# Patient Record
Sex: Male | Born: 1975 | Race: Black or African American | Hispanic: No | Marital: Married | Smoking: Never smoker
Health system: Southern US, Community
[De-identification: ages and names within clinical notes are randomized; demographics above are authoritative.]

## PROBLEM LIST (undated history)

## (undated) DIAGNOSIS — I10 Essential (primary) hypertension: Secondary | ICD-10-CM

---

## 2006-07-06 ENCOUNTER — Other Ambulatory Visit: Payer: Self-pay

## 2006-07-06 ENCOUNTER — Emergency Department: Payer: Self-pay | Admitting: Emergency Medicine

## 2016-01-13 ENCOUNTER — Encounter: Payer: Self-pay | Admitting: *Deleted

## 2016-01-13 ENCOUNTER — Emergency Department: Payer: Self-pay

## 2016-01-13 ENCOUNTER — Emergency Department
Admission: EM | Admit: 2016-01-13 | Discharge: 2016-01-13 | Disposition: A | Discharge status: Home / Self Care | Payer: Self-pay | Attending: Emergency Medicine | Admitting: Emergency Medicine

## 2016-01-13 DIAGNOSIS — K8050 Calculus of bile duct without cholangitis or cholecystitis without obstruction: Secondary | ICD-10-CM

## 2016-01-13 DIAGNOSIS — K8020 Calculus of gallbladder without cholecystitis without obstruction: Secondary | ICD-10-CM

## 2016-01-13 DIAGNOSIS — K8070 Calculus of gallbladder and bile duct without cholecystitis without obstruction: Secondary | ICD-10-CM | POA: Insufficient documentation

## 2016-01-13 DIAGNOSIS — R1011 Right upper quadrant pain: Secondary | ICD-10-CM

## 2016-01-13 LAB — URINALYSIS COMPLETE WITH MICROSCOPIC (ARMC ONLY)
BACTERIA UA: NONE SEEN
Bilirubin Urine: NEGATIVE
Glucose, UA: NEGATIVE mg/dL
Ketones, ur: NEGATIVE mg/dL
Leukocytes, UA: NEGATIVE
Nitrite: NEGATIVE
PH: 7 (ref 5.0–8.0)
PROTEIN: NEGATIVE mg/dL
SQUAMOUS EPITHELIAL / LPF: NONE SEEN
Specific Gravity, Urine: 1.01 (ref 1.005–1.030)

## 2016-01-13 LAB — COMPREHENSIVE METABOLIC PANEL
ALK PHOS: 44 U/L (ref 38–126)
ALT: 25 U/L (ref 17–63)
AST: 40 U/L (ref 15–41)
Albumin: 4.4 g/dL (ref 3.5–5.0)
Anion gap: 6 (ref 5–15)
BUN: 13 mg/dL (ref 6–20)
CALCIUM: 9.3 mg/dL (ref 8.9–10.3)
CO2: 28 mmol/L (ref 22–32)
CREATININE: 1.16 mg/dL (ref 0.61–1.24)
Chloride: 105 mmol/L (ref 101–111)
GFR calc non Af Amer: >60 mL/min (ref >60)
Glucose, Bld: 125 mg/dL — ABNORMAL HIGH (ref 65–99)
Potassium: 3.6 mmol/L (ref 3.5–5.1)
SODIUM: 139 mmol/L (ref 135–145)
Total Bilirubin: 0.7 mg/dL (ref 0.3–1.2)
Total Protein: 8 g/dL (ref 6.5–8.1)

## 2016-01-13 LAB — CBC
HEMATOCRIT: 43.4 % (ref 40.0–52.0)
Hemoglobin: 14.6 g/dL (ref 13.0–18.0)
MCH: 29.7 pg (ref 26.0–34.0)
MCHC: 33.5 g/dL (ref 32.0–36.0)
MCV: 88.6 fL (ref 80.0–100.0)
Platelets: 325 10*3/uL (ref 150–440)
RBC: 4.9 MIL/uL (ref 4.40–5.90)
RDW: 12.2 % (ref 11.5–14.5)
WBC: 7.4 10*3/uL (ref 3.8–10.6)

## 2016-01-13 LAB — LIPASE, BLOOD: Lipase: 26 U/L (ref 11–51)

## 2016-01-13 MED ORDER — ONDANSETRON 4 MG PO TBDP: 4.0000 mg | ORAL_TABLET | Freq: Three times a day (TID) | ORAL | 0 refills | Status: DC | PRN

## 2016-01-13 MED ORDER — FAMOTIDINE IN NACL 20-0.9 MG/50ML-% IV SOLN
20.0000 mg | Freq: Once | INTRAVENOUS | Status: AC
  Administered 2016-01-13: 20 mg via INTRAVENOUS
  Filled 2016-01-13: qty 50

## 2016-01-13 MED ORDER — OXYCODONE-ACETAMINOPHEN 5-325 MG PO TABS: 1.0000 | ORAL_TABLET | ORAL | 0 refills | Status: DC | PRN

## 2016-01-13 NOTE — ED Notes (Signed)
Pt given additional information for follow-up for BP with Dr. Kenard Gowerrew as PCP. Pt also given BP that were taken while here to give his doctor.

## 2016-01-13 NOTE — ED Notes (Signed)
Pt returned to room from US.

## 2016-01-13 NOTE — Discharge Instructions (Signed)
1. Take medicines as needed for pain & nausea (Percocet/Zofran #20). 2. Clear liquids x 12 hours, then bland diet x 1 week, then slowly advance diet as tolerated. Avoid fatty, greasy, spicy foods and drinks. 3. Return to the ER for worsening symptoms, persistent vomiting, fever, difficulty breathing or other concerns.  

## 2016-01-13 NOTE — ED Triage Notes (Signed)
Pt c/o recurrent RUQ abdominal pain. Pt states he has had these episodes of pain approximately every other month. Pt states that the pain he is experiencing now is no different or worse than pain he has experienced in the past. Pt states after he woke to urinate, he was unable to return to sleep secondary to onset of pain. Pt denies urinary sxs. Pt denies n/v/d or constipation.

## 2016-01-13 NOTE — ED Provider Notes (Signed)
[facility] Emergency Department Provider Note   ____________________________________________   First MD Initiated Contact with Patient 01/13/16 [phone]     (approximate)  I have reviewed the triage vital signs and the nursing notes.   HISTORY  Chief Complaint Abdominal Pain    HPI Troy Garcia is a 40 y.o. male who presents to the ED from home with a chief complaint of abdominal pain. Patient reports right upper quadrant/epigastric sharp pain and burning discomfort felt when he awoke to urinate. Patient has had similar pain approximately 3 times before; has not had evaluation. Symptoms are not associated with fever, chills, nausea, vomiting. Patient denies recent chest pain, shortness of breath, dysuria, diarrhea. Denies recent travel or trauma. Nothing makes this pain better or worse.   Past medical history None  There are no active problems to display for this patient.   History reviewed. No pertinent surgical history.  Prior to Admission medications   Medication Sig Start Date End Date Taking? Authorizing Provider  ondansetron (ZOFRAN ODT) 4 MG disintegrating tablet Take 1 tablet (4 mg total) by mouth every 8 (eight) hours as needed for nausea or vomiting. 01/13/16   Irean HongJade J Neveen Daponte, MD  oxyCODONE-acetaminophen (ROXICET) 5-325 MG tablet Take 1 tablet by mouth every 4 (four) hours as needed for severe pain. 01/13/16   Irean HongJade J Shaughnessy Gethers, MD    Allergies Patient has no known allergies.  History reviewed. No pertinent family history.  Social History Social History  Substance Use Topics  . Smoking status: Never Smoker  . Smokeless tobacco: Never Used  . Alcohol use No    Review of Systems  Constitutional: No fever/chills. Eyes: No visual changes. ENT: No sore throat. Cardiovascular: Denies chest pain. Respiratory: Denies shortness of breath. Gastrointestinal: Positive for abdominal pain.  No nausea, no vomiting.  No diarrhea.  No  constipation. Genitourinary: Negative for dysuria. Musculoskeletal: Negative for back pain. Skin: Negative for rash. Neurological: Negative for headaches, focal weakness or numbness.  10-point ROS otherwise negative.  ____________________________________________   PHYSICAL EXAM:  VITAL SIGNS: ED Triage Vitals  Enc Vitals Group     BP 01/13/16 0424 (!) 150/100     Pulse Rate 01/13/16 0424 70     Resp 01/13/16 0424 18     Temp 01/13/16 0424 98.4 F (36.9 C)     Temp Source 01/13/16 0424 Oral     SpO2 01/13/16 0424 98 %     Weight 01/13/16 0424 263 lb (119.3 kg)     Height 01/13/16 0424 5\' 10"  (1.778 m)     Head Circumference --      Peak Flow --      Pain Score 01/13/16 0425 6     Pain Loc --      Pain Edu? --      Excl. in GC? --     Constitutional: Alert and oriented. Well appearing and in no acute distress. Eyes: Conjunctivae are normal. PERRL. EOMI. Head: Atraumatic. Nose: No congestion/rhinnorhea. Mouth/Throat: Mucous membranes are moist.  Oropharynx non-erythematous. Neck: No stridor.   Cardiovascular: Normal rate, regular rhythm. Grossly normal heart sounds.  Good peripheral circulation. Respiratory: Normal respiratory effort.  No retractions. Lungs CTAB. Gastrointestinal: Soft and mildly tender to palpation right upper quadrant and epigastrium without rebound or guarding. No distention. No abdominal bruits. No CVA tenderness. Musculoskeletal: No lower extremity tenderness nor edema.  No joint effusions. Neurologic:  Normal speech and language. No gross focal neurologic deficits are appreciated. No gait instability.  Skin:  Skin is warm, dry and intact. No rash noted. Psychiatric: Mood and affect are normal. Speech and behavior are normal.  ____________________________________________   LABS (all labs ordered are listed, but only abnormal results are displayed)  Labs Reviewed  COMPREHENSIVE METABOLIC PANEL - Abnormal; Notable for the following:       Result  Value   Glucose, Bld 125 (*)    All other components within normal limits  URINALYSIS COMPLETEWITH MICROSCOPIC ([facility] ONLY) - Abnormal; Notable for the following:    Color, Urine STRAW (*)    APPearance CLEAR (*)    Hgb urine dipstick 1+ (*)    All other components within normal limits  LIPASE, BLOOD  CBC   ____________________________________________  EKG  None ____________________________________________  RADIOLOGY  RUQ ultrasound interpreted per Dr. Harrie JeansStratton: Gallstones and gallbladder sludge. No secondary signs of  cholecystitis.   ____________________________________________   PROCEDURES  Procedure(s) performed: None  Procedures  Critical Care performed: No  ____________________________________________   INITIAL IMPRESSION / ASSESSMENT AND PLAN / ED COURSE  Pertinent labs & imaging results that were available during my care of the patient were reviewed by me and considered in my medical decision making (see chart for details).  40 year old male who presents with recurrent right upper quadrant abdominal pain. Clinically suspect cholelithiasis. Will obtain screening lab work, right upper quadrant abdominal ultrasound; administer IV Pepcid for burning type sensation.  Clinical Course as of Jan 12 546  Fri Jan 13, 2016  [id number] Updated patient of laboratory and imaging results. Will refer to general surgery for outpatient follow-up. Strict return precautions given. Patient verbalizes understanding and agrees with plan of care.  [JS]    Clinical Course User Index [JS] Irean HongJade J Katalena Malveaux, MD     ____________________________________________   FINAL CLINICAL IMPRESSION(S) / ED DIAGNOSES  Final diagnoses:  Right upper quadrant abdominal pain  Calculus of gallbladder without cholecystitis without obstruction  Biliary colic      NEW MEDICATIONS STARTED DURING THIS VISIT:  New Prescriptions   ONDANSETRON (ZOFRAN ODT) 4 MG DISINTEGRATING TABLET    Take 1 tablet (4  mg total) by mouth every 8 (eight) hours as needed for nausea or vomiting.   OXYCODONE-ACETAMINOPHEN (ROXICET) 5-325 MG TABLET    Take 1 tablet by mouth every 4 (four) hours as needed for severe pain.     Note:  This document was prepared using Dragon voice recognition software and may include unintentional dictation errors.    Irean HongJade J Juelz Claar, MD 01/13/16 [phone]

## 2016-01-13 NOTE — ED Notes (Signed)
Pt in US

## 2016-01-13 NOTE — ED Notes (Signed)
Pt stating that he has had 3 episodes of RUQ pain. Pt stating that each time he is awakened from sleep by the pain. Pt denying n/v/d , changes in BM , and no issues with urination. Pt stating that the pain start gradual and grows in intensity and then goes away.

## 2016-01-19 ENCOUNTER — Ambulatory Visit: Payer: Self-pay | Admitting: Surgery

## 2016-06-13 ENCOUNTER — Encounter: Payer: Self-pay | Admitting: *Deleted

## 2016-06-13 ENCOUNTER — Emergency Department: Payer: Medicaid Other | Admitting: Anesthesiology

## 2016-06-13 ENCOUNTER — Observation Stay
Admission: EM | Admit: 2016-06-13 | Discharge: 2016-06-14 | Disposition: A | Discharge status: Home / Self Care | Payer: Medicaid Other | Attending: Surgery | Admitting: Surgery

## 2016-06-13 ENCOUNTER — Encounter
Admission: EM | Disposition: A | Discharge status: Home / Self Care | Payer: Self-pay | Source: Home / Self Care | Attending: Emergency Medicine

## 2016-06-13 ENCOUNTER — Emergency Department: Payer: Medicaid Other

## 2016-06-13 DIAGNOSIS — R1011 Right upper quadrant pain: Secondary | ICD-10-CM

## 2016-06-13 DIAGNOSIS — K660 Peritoneal adhesions (postprocedural) (postinfection): Secondary | ICD-10-CM | POA: Insufficient documentation

## 2016-06-13 DIAGNOSIS — K8010 Calculus of gallbladder with chronic cholecystitis without obstruction: Principal | ICD-10-CM | POA: Insufficient documentation

## 2016-06-13 DIAGNOSIS — K819 Cholecystitis, unspecified: Secondary | ICD-10-CM

## 2016-06-13 DIAGNOSIS — K810 Acute cholecystitis: Secondary | ICD-10-CM | POA: Diagnosis present

## 2016-06-13 DIAGNOSIS — K8050 Calculus of bile duct without cholangitis or cholecystitis without obstruction: Secondary | ICD-10-CM

## 2016-06-13 DIAGNOSIS — Z6838 Body mass index (BMI) 38.0-38.9, adult: Secondary | ICD-10-CM | POA: Diagnosis not present

## 2016-06-13 HISTORY — PX: CHOLECYSTECTOMY: SHX55

## 2016-06-13 LAB — COMPREHENSIVE METABOLIC PANEL
ALBUMIN: 4.3 g/dL (ref 3.5–5.0)
ALT: 30 U/L (ref 17–63)
ANION GAP: 11 (ref 5–15)
AST: 47 U/L — AB (ref 15–41)
Alkaline Phosphatase: 48 U/L (ref 38–126)
BILIRUBIN TOTAL: 0.6 mg/dL (ref 0.3–1.2)
BUN: 9 mg/dL (ref 6–20)
CHLORIDE: 101 mmol/L (ref 101–111)
CO2: 24 mmol/L (ref 22–32)
Calcium: 9.2 mg/dL (ref 8.9–10.3)
Creatinine, Ser: 1.04 mg/dL (ref 0.61–1.24)
GFR calc Af Amer: >60 mL/min (ref >60)
Glucose, Bld: 157 mg/dL — ABNORMAL HIGH (ref 65–99)
POTASSIUM: 3.7 mmol/L (ref 3.5–5.1)
Sodium: 136 mmol/L (ref 135–145)
TOTAL PROTEIN: 8.1 g/dL (ref 6.5–8.1)

## 2016-06-13 LAB — URINALYSIS, COMPLETE (UACMP) WITH MICROSCOPIC
BACTERIA UA: NONE SEEN
BILIRUBIN URINE: NEGATIVE
Glucose, UA: NEGATIVE mg/dL
Hgb urine dipstick: NEGATIVE
Ketones, ur: NEGATIVE mg/dL
LEUKOCYTES UA: NEGATIVE
NITRITE: NEGATIVE
Protein, ur: 100 mg/dL — AB
SQUAMOUS EPITHELIAL / LPF: NONE SEEN
Specific Gravity, Urine: 1.027 (ref 1.005–1.030)
pH: 8 (ref 5.0–8.0)

## 2016-06-13 LAB — LIPASE, BLOOD: LIPASE: 21 U/L (ref 11–51)

## 2016-06-13 LAB — CBC
HEMATOCRIT: 43.8 % (ref 40.0–52.0)
HEMOGLOBIN: 14.5 g/dL (ref 13.0–18.0)
MCH: 29.5 pg (ref 26.0–34.0)
MCHC: 33.2 g/dL (ref 32.0–36.0)
MCV: 88.9 fL (ref 80.0–100.0)
Platelets: 382 10*3/uL (ref 150–440)
RBC: 4.93 MIL/uL (ref 4.40–5.90)
RDW: 12 % (ref 11.5–14.5)
WBC: 14.4 10*3/uL — AB (ref 3.8–10.6)

## 2016-06-13 SURGERY — LAPAROSCOPIC CHOLECYSTECTOMY
Anesthesia: General | Wound class: Clean

## 2016-06-13 MED ORDER — BUPIVACAINE-EPINEPHRINE 0.25% -1:200000 IJ SOLN
INTRAMUSCULAR | Status: DC | PRN
  Administered 2016-06-13: 30 mL

## 2016-06-13 MED ORDER — DEXAMETHASONE SODIUM PHOSPHATE 10 MG/ML IJ SOLN
INTRAMUSCULAR | Status: DC | PRN
  Administered 2016-06-13: 10 mg via INTRAVENOUS

## 2016-06-13 MED ORDER — BUPIVACAINE LIPOSOME 1.3 % IJ SUSP
INTRAMUSCULAR | Status: AC
  Filled 2016-06-13: qty 20

## 2016-06-13 MED ORDER — FENTANYL CITRATE (PF) 100 MCG/2ML IJ SOLN
INTRAMUSCULAR | Status: DC | PRN
  Administered 2016-06-13: 25 ug via INTRAVENOUS
  Administered 2016-06-13: 50 ug via INTRAVENOUS
  Administered 2016-06-13: 25 ug via INTRAVENOUS
  Administered 2016-06-13 (×2): 50 ug via INTRAVENOUS

## 2016-06-13 MED ORDER — SUGAMMADEX SODIUM 200 MG/2ML IV SOLN
INTRAVENOUS | Status: DC | PRN
  Administered 2016-06-13: 250 mg via INTRAVENOUS

## 2016-06-13 MED ORDER — ONDANSETRON HCL 4 MG/2ML IJ SOLN: 4.0000 mg | Freq: Four times a day (QID) | INTRAMUSCULAR | Status: DC | PRN

## 2016-06-13 MED ORDER — PROPOFOL 10 MG/ML IV BOLUS
INTRAVENOUS | Status: DC | PRN
  Administered 2016-06-13: 200 mg via INTRAVENOUS

## 2016-06-13 MED ORDER — FENTANYL CITRATE (PF) 100 MCG/2ML IJ SOLN
INTRAMUSCULAR | Status: AC
Start: 2016-06-13 — End: 2016-06-13
  Filled 2016-06-13: qty 2

## 2016-06-13 MED ORDER — BUPIVACAINE-EPINEPHRINE (PF) 0.25% -1:200000 IJ SOLN
INTRAMUSCULAR | Status: AC
  Filled 2016-06-13: qty 30

## 2016-06-13 MED ORDER — SODIUM CHLORIDE 0.9 % IV SOLN
INTRAVENOUS | Status: DC | PRN
  Administered 2016-06-13: 70 mL

## 2016-06-13 MED ORDER — PHENYLEPHRINE HCL 10 MG/ML IJ SOLN
INTRAMUSCULAR | Status: DC | PRN
  Administered 2016-06-13: 200 ug via INTRAVENOUS

## 2016-06-13 MED ORDER — FENTANYL CITRATE (PF) 100 MCG/2ML IJ SOLN
INTRAMUSCULAR | Status: AC
  Administered 2016-06-13: 25 ug via INTRAVENOUS
  Filled 2016-06-13: qty 2

## 2016-06-13 MED ORDER — PIPERACILLIN-TAZOBACTAM 3.375 G IVPB
3.3750 g | Freq: Three times a day (TID) | INTRAVENOUS | Status: DC
  Administered 2016-06-13 – 2016-06-14 (×2): 3.375 g via INTRAVENOUS
  Filled 2016-06-13 (×4): qty 50

## 2016-06-13 MED ORDER — PANTOPRAZOLE SODIUM 40 MG IV SOLR
40.0000 mg | Freq: Every day | INTRAVENOUS | Status: DC
  Administered 2016-06-13: 40 mg via INTRAVENOUS
  Filled 2016-06-13: qty 40

## 2016-06-13 MED ORDER — MORPHINE SULFATE (PF) 4 MG/ML IV SOLN: 2.0000 mg | INTRAVENOUS | Status: DC | PRN

## 2016-06-13 MED ORDER — PROPOFOL 10 MG/ML IV BOLUS
INTRAVENOUS | Status: AC
  Filled 2016-06-13: qty 20

## 2016-06-13 MED ORDER — ACETAMINOPHEN 10 MG/ML IV SOLN
INTRAVENOUS | Status: AC
  Filled 2016-06-13: qty 100

## 2016-06-13 MED ORDER — SODIUM CHLORIDE 0.9 % IJ SOLN
INTRAMUSCULAR | Status: AC
  Filled 2016-06-13: qty 20

## 2016-06-13 MED ORDER — DEXAMETHASONE SODIUM PHOSPHATE 10 MG/ML IJ SOLN
INTRAMUSCULAR | Status: AC
  Filled 2016-06-13: qty 1

## 2016-06-13 MED ORDER — SODIUM CHLORIDE 0.9 % IV BOLUS (SEPSIS)
1000.0000 mL | Freq: Once | INTRAVENOUS | Status: AC
  Administered 2016-06-13: 1000 mL via INTRAVENOUS

## 2016-06-13 MED ORDER — ONDANSETRON HCL 4 MG/2ML IJ SOLN: 4.0000 mg | Freq: Once | INTRAMUSCULAR | Status: DC | PRN

## 2016-06-13 MED ORDER — ACETAMINOPHEN 10 MG/ML IV SOLN
INTRAVENOUS | Status: DC | PRN
  Administered 2016-06-13: 1000 mg via INTRAVENOUS

## 2016-06-13 MED ORDER — LACTATED RINGERS IV SOLN
INTRAVENOUS | Status: DC
  Administered 2016-06-13 – 2016-06-14 (×2): via INTRAVENOUS

## 2016-06-13 MED ORDER — OXYCODONE HCL 5 MG PO TABS
5.0000 mg | ORAL_TABLET | ORAL | Status: DC | PRN
  Administered 2016-06-13: 5 mg via ORAL
  Filled 2016-06-13: qty 1

## 2016-06-13 MED ORDER — PIPERACILLIN-TAZOBACTAM 3.375 G IVPB
INTRAVENOUS | Status: AC
  Administered 2016-06-13: 3.375 g via INTRAVENOUS
  Filled 2016-06-13: qty 50

## 2016-06-13 MED ORDER — LIDOCAINE HCL (CARDIAC) 20 MG/ML IV SOLN
INTRAVENOUS | Status: DC | PRN
  Administered 2016-06-13: 50 mg via INTRAVENOUS

## 2016-06-13 MED ORDER — KETOROLAC TROMETHAMINE 30 MG/ML IJ SOLN
30.0000 mg | Freq: Four times a day (QID) | INTRAMUSCULAR | Status: DC
  Administered 2016-06-13 – 2016-06-14 (×3): 30 mg via INTRAVENOUS
  Filled 2016-06-13 (×3): qty 1

## 2016-06-13 MED ORDER — LIDOCAINE HCL (PF) 2 % IJ SOLN
INTRAMUSCULAR | Status: AC
  Filled 2016-06-13: qty 2

## 2016-06-13 MED ORDER — ONDANSETRON HCL 4 MG/2ML IJ SOLN
INTRAMUSCULAR | Status: DC | PRN
  Administered 2016-06-13: 4 mg via INTRAVENOUS

## 2016-06-13 MED ORDER — KETOROLAC TROMETHAMINE 30 MG/ML IJ SOLN
INTRAMUSCULAR | Status: DC | PRN
  Administered 2016-06-13: 30 mg via INTRAVENOUS

## 2016-06-13 MED ORDER — ROCURONIUM BROMIDE 100 MG/10ML IV SOLN
INTRAVENOUS | Status: AC
  Filled 2016-06-13: qty 1

## 2016-06-13 MED ORDER — PIPERACILLIN-TAZOBACTAM 3.375 G IVPB 30 MIN: 3.3750 g | Freq: Once | INTRAVENOUS | Status: DC

## 2016-06-13 MED ORDER — MIDAZOLAM HCL 2 MG/2ML IJ SOLN
INTRAMUSCULAR | Status: AC
  Filled 2016-06-13: qty 2

## 2016-06-13 MED ORDER — MIDAZOLAM HCL 2 MG/2ML IJ SOLN
INTRAMUSCULAR | Status: DC | PRN
  Administered 2016-06-13: 2 mg via INTRAVENOUS

## 2016-06-13 MED ORDER — FENTANYL CITRATE (PF) 100 MCG/2ML IJ SOLN
INTRAMUSCULAR | Status: AC
  Filled 2016-06-13: qty 2

## 2016-06-13 MED ORDER — FENTANYL CITRATE (PF) 100 MCG/2ML IJ SOLN
25.0000 ug | INTRAMUSCULAR | Status: DC | PRN
Start: 2016-06-13 — End: 2016-06-13
  Administered 2016-06-13 (×4): 25 ug via INTRAVENOUS

## 2016-06-13 MED ORDER — LACTATED RINGERS IV SOLN
INTRAVENOUS | Status: DC
  Administered 2016-06-13: 14:00:00 via INTRAVENOUS

## 2016-06-13 MED ORDER — ONDANSETRON HCL 4 MG/2ML IJ SOLN
INTRAMUSCULAR | Status: AC
  Filled 2016-06-13: qty 2

## 2016-06-13 MED ORDER — ONDANSETRON 4 MG PO TBDP: 4.0000 mg | ORAL_TABLET | Freq: Four times a day (QID) | ORAL | Status: DC | PRN

## 2016-06-13 MED ORDER — ACETAMINOPHEN 500 MG PO TABS
1000.0000 mg | ORAL_TABLET | Freq: Four times a day (QID) | ORAL | Status: DC
  Administered 2016-06-13 – 2016-06-14 (×3): 1000 mg via ORAL
  Filled 2016-06-13 (×3): qty 2

## 2016-06-13 MED ORDER — ENOXAPARIN SODIUM 40 MG/0.4ML ~~LOC~~ SOLN
40.0000 mg | SUBCUTANEOUS | Status: DC
  Administered 2016-06-14: 40 mg via SUBCUTANEOUS
  Filled 2016-06-13: qty 0.4

## 2016-06-13 MED ORDER — ROCURONIUM BROMIDE 100 MG/10ML IV SOLN
INTRAVENOUS | Status: DC | PRN
  Administered 2016-06-13: 20 mg via INTRAVENOUS
  Administered 2016-06-13: 40 mg via INTRAVENOUS
  Administered 2016-06-13: 20 mg via INTRAVENOUS

## 2016-06-13 SURGICAL SUPPLY — 60 items
APPLICATOR COTTON TIP 6IN STRL (MISCELLANEOUS) ×3 IMPLANT
APPLIER CLIP 5 13 M/L LIGAMAX5 (MISCELLANEOUS) ×3
BLADE SURG 15 STRL LF DISP TIS (BLADE) ×1 IMPLANT
BLADE SURG 15 STRL SS (BLADE) ×2
CANISTER SUCT 1200ML W/VALVE (MISCELLANEOUS) ×3 IMPLANT
CANISTER SUCT 3000ML (MISCELLANEOUS) ×3 IMPLANT
CHLORAPREP W/TINT 26ML (MISCELLANEOUS) ×3 IMPLANT
CHOLANGIOGRAM CATH TAUT (CATHETERS) IMPLANT
CLEANER CAUTERY TIP 5X5 PAD (MISCELLANEOUS) ×1 IMPLANT
CLIP APPLIE 5 13 M/L LIGAMAX5 (MISCELLANEOUS) ×1 IMPLANT
DECANTER SPIKE VIAL GLASS SM (MISCELLANEOUS) ×6 IMPLANT
DERMABOND ADVANCED (GAUZE/BANDAGES/DRESSINGS) ×4
DERMABOND ADVANCED .7 DNX12 (GAUZE/BANDAGES/DRESSINGS) ×2 IMPLANT
DEVICE TROCAR PUNCTURE CLOSURE (ENDOMECHANICALS) IMPLANT
DRAIN CHANNEL JP 19F (MISCELLANEOUS) ×3 IMPLANT
DRAPE C-ARM XRAY 36X54 (DRAPES) IMPLANT
DRAPE INCISE IOBAN 66X45 STRL (DRAPES) ×3 IMPLANT
ELECT CAUTERY BLADE 6.4 (BLADE) ×3 IMPLANT
ELECT REM PT RETURN 9FT ADLT (ELECTROSURGICAL) ×3
ELECTRODE REM PT RTRN 9FT ADLT (ELECTROSURGICAL) ×1 IMPLANT
ENDOPOUCH RETRIEVER 10 (MISCELLANEOUS) ×3 IMPLANT
GLOVE BIO SURGEON STRL SZ7 (GLOVE) ×9 IMPLANT
GLOVE BIOGEL PI IND STRL 6.5 (GLOVE) ×1 IMPLANT
GLOVE BIOGEL PI IND STRL 7.0 (GLOVE) ×1 IMPLANT
GLOVE BIOGEL PI INDICATOR 6.5 (GLOVE) ×2
GLOVE BIOGEL PI INDICATOR 7.0 (GLOVE) ×2
GOWN STRL REUS W/ TWL LRG LVL3 (GOWN DISPOSABLE) ×3 IMPLANT
GOWN STRL REUS W/TWL LRG LVL3 (GOWN DISPOSABLE) ×6
HEMOSTAT SURGICEL 2X14 (HEMOSTASIS) ×6 IMPLANT
IRRIGATION STRYKERFLOW (MISCELLANEOUS) ×1 IMPLANT
IRRIGATOR STRYKERFLOW (MISCELLANEOUS) ×3
IV CATH ANGIO 12GX3 LT BLUE (NEEDLE) ×3 IMPLANT
IV NS 1000ML (IV SOLUTION) ×4
IV NS 1000ML BAXH (IV SOLUTION) ×2 IMPLANT
L-HOOK LAP DISP 36CM (ELECTROSURGICAL) ×3
LHOOK LAP DISP 36CM (ELECTROSURGICAL) ×1 IMPLANT
NEEDLE HYPO 22GX1.5 SAFETY (NEEDLE) ×6 IMPLANT
PACK LAP CHOLECYSTECTOMY (MISCELLANEOUS) ×3 IMPLANT
PAD CLEANER CAUTERY TIP 5X5 (MISCELLANEOUS) ×2
PENCIL ELECTRO HAND CTR (MISCELLANEOUS) ×3 IMPLANT
SCISSORS METZENBAUM CVD 33 (INSTRUMENTS) ×3 IMPLANT
SLEEVE ENDOPATH XCEL 5M (ENDOMECHANICALS) ×9 IMPLANT
SOL ANTI-FOG 6CC FOG-OUT (MISCELLANEOUS) ×1 IMPLANT
SOL FOG-OUT ANTI-FOG 6CC (MISCELLANEOUS) ×2
SPONGE LAP 18X18 5 PK (GAUZE/BANDAGES/DRESSINGS) ×6 IMPLANT
STOPCOCK 3 WAY  REPLAC (MISCELLANEOUS) IMPLANT
SUT ETHIBOND 0 MO6 C/R (SUTURE) IMPLANT
SUT MNCRL AB 4-0 PS2 18 (SUTURE) ×3 IMPLANT
SUT PDS AB 0 CT1 27 (SUTURE) ×6 IMPLANT
SUT SILK 2 0 SH (SUTURE) ×3 IMPLANT
SUT VIC AB 0 CT2 27 (SUTURE) IMPLANT
SUT VICRYL 0 AB UR-6 (SUTURE) ×6 IMPLANT
SYR 20CC LL (SYRINGE) ×9 IMPLANT
SYS LAPSCP GELPORT 120MM (MISCELLANEOUS) ×3
SYSTEM LAPSCP GELPORT 120MM (MISCELLANEOUS) ×1 IMPLANT
TAPE TRANSPORE STRL 2 31045 (GAUZE/BANDAGES/DRESSINGS) ×3 IMPLANT
TROCAR XCEL BLUNT TIP 100MML (ENDOMECHANICALS) ×3 IMPLANT
TROCAR XCEL NON-BLD 5MMX100MML (ENDOMECHANICALS) ×3 IMPLANT
TUBING INSUFFLATOR HI FLOW (MISCELLANEOUS) ×3 IMPLANT
WATER STERILE IRR 1000ML POUR (IV SOLUTION) ×3 IMPLANT

## 2016-06-13 NOTE — Anesthesia Post-op Follow-up Note (Cosign Needed)
Anesthesia QCDR form completed.        

## 2016-06-13 NOTE — ED Provider Notes (Addendum)
[facility] Emergency Department Provider Note   ____________________________________________   First MD Initiated Contact with Patient 06/13/16 1110     (approximate)  I have reviewed the triage vital signs and the nursing notes.   HISTORY  Chief Complaint Abdominal Pain    HPI Troy Garcia is a 41 y.o. male patient was seen yesterday in the ER with right upper quadrant pain and diagnosed with biliary colic. He ate a pizza last night and the pain came back again so he came back as instructed. Having right upper quadrant pain. Ultrasound yesterday showed gallbladder stones and sludge but no obstruction or signs of cholecystitis.  History reviewed. No pertinent past medical history.  Patient Active Problem List   Diagnosis Date Noted  . Cholecystitis, acute     History reviewed. No pertinent surgical history.  Prior to Admission medications   Medication Sig Start Date End Date Taking? Authorizing Provider  acetaminophen (TYLENOL) 500 MG tablet Take 500 mg by mouth every 6 (six) hours as needed.   Yes Historical Provider, MD  ibuprofen (ADVIL,MOTRIN) 200 MG tablet Take 200 mg by mouth every 6 (six) hours as needed.   Yes Historical Provider, MD  oxyCODONE-acetaminophen (ROXICET) 5-325 MG tablet Take 1 tablet by mouth every 4 (four) hours as needed for severe pain. 01/13/16  Yes Irean Hong, MD  ondansetron (ZOFRAN ODT) 4 MG disintegrating tablet Take 1 tablet (4 mg total) by mouth every 8 (eight) hours as needed for nausea or vomiting. Patient not taking: Reported on 06/13/2016 01/13/16   Irean Hong, MD    Allergies Patient has no known allergies.  History reviewed. No pertinent family history.  Social History Social History  Substance Use Topics  . Smoking status: Never Smoker  . Smokeless tobacco: Never Used  . Alcohol use No    Review of Systems  Constitutional: No fever/chills Eyes: No visual changes. ENT: No sore  throat. Cardiovascular: Denies chest pain. Respiratory: Denies shortness of breath. Gastrointestinal abdominal pain.  Genitourinary: Negative for dysuria. Musculoskeletal: Negative for back pain. Skin: Negative for rash. Neurological: Negative for headaches, focal weakness or numbness.   ____________________________________________   PHYSICAL EXAM:  VITAL SIGNS: ED Triage Vitals patient   Enc Vitals Group     BP 139/74     Pulse Rate 95     Resp 18     Temp (!) 100.9 F (38.3 C)     Temp Source Oral     SpO2 95 %     Weight 267 lb (121.1 kg)     Height  (1.778 m)     Head Circumference      Peak Flow      Pain Score 9     Pain Loc      Pain Edu?      Excl. in GC?     Constitutional: Alert and oriented. Well appearing and in no acute distress. Eyes: Conjunctivae are normal. PERRL. EOMI. Head: Atraumatic. Nose: No congestion/rhinnorhea. Mouth/Throat: Mucous membranes are moist.  Oropharynx non-erythematous. Neck: No stridor Cardiovascular: Normal rate, regular rhythm. Grossly normal heart sounds.  Good peripheral circulation. Respiratory: Normal respiratory effort.  No retractions. Lungs CTAB. Gastrointestinal: Soft With right upper quadrant tenderness palpation percussion especially in the area of the gallbladder. No distention. No abdominal bruits. No CVA tenderness. Musculoskeletal: No lower extremity tenderness nor edema.  No joint effusions. Neurologic:  Normal speech and language. No gross focal neurologic deficits are appreciated. No gait instability.  Skin:  Skin is warm, dry and intact. No rash noted. Psychiatric: Mood and affect are normal. Speech and behavior are normal.  ____________________________________________   LABS (all labs ordered are listed, but only abnormal results are displayed)  Labs Reviewed  COMPREHENSIVE METABOLIC PANEL - Abnormal; Notable for the following:       Result Value   Glucose, Bld 157 (*)    AST 47 (*)    All other  components within normal limits  CBC - Abnormal; Notable for the following:    WBC 14.4 (*)    All other components within normal limits  URINALYSIS, COMPLETE (UACMP) WITH MICROSCOPIC - Abnormal; Notable for the following:    Color, Urine YELLOW (*)    APPearance CLEAR (*)    Protein, ur 100 (*)    All other components within normal limits  LIPASE, BLOOD  HIV ANTIBODY (ROUTINE TESTING)  SURGICAL PATHOLOGY   ____________________________________________  EKG   ____________________________________________  RADIOLOGY Study Result   CLINICAL DATA:  Right upper quadrant pain.  EXAM: US ABDOMEN LIMITED - RIGHT UPPER QUADRANT  COMPARISON:  01/13/2016 .  FINDINGS: Gallbladder:  Multiple non mobile stones measuring up 8.4 mm are noted. Gallbladder wall thickness 4.8 mm. Cholecystitis cannot be excluded. Negative Murphy sign.  Common bile duct:  Diameter: 4.5 mm  Liver:  Slight increase echogenicity consistent fatty infiltration and/or hepatocellular disease.  IMPRESSION: 1. Multiple gallstones measuring up to 8.4 mm. Gallbladder wall is thickened at 4.8 mm. Cholecystitis cannot be excluded. Negative Murphy sign. No biliary distention.  2. Mild increased hepatic echogenicity consistent fatty infiltration and/or hepatocellular disease.   Electronically Signed   By: Maisie Fus  Register   On: 06/13/2016 12:48      ____________________________________________   PROCEDURES  Procedure(s) performed:  Procedures  Critical Care performed:   ____________________________________________   INITIAL IMPRESSION / ASSESSMENT AND PLAN / ED COURSE  Pertinent labs & imaging results that were available during my care of the patient were reviewed by me and considered in my medical decision making (see chart for details).  Patient now has minimal elevation of the LFTs. This was not present last time I have called the surgeon but he is in surgery. Patient does  not want pain medicine at this time he says he's tolerating it.      ____________________________________________   FINAL CLINICAL IMPRESSION(S) / ED DIAGNOSES  Final diagnoses:  Biliary colic  Cholecystitis      NEW MEDICATIONS STARTED DURING THIS VISIT:  Current Discharge Medication List       Note:  This document was prepared using Dragon voice recognition software and may include unintentional dictation errors.    Arnaldo Natal, MD 06/13/16 1906    Arnaldo Natal, MD 06/13/16 [phone]

## 2016-06-13 NOTE — Anesthesia Preprocedure Evaluation (Signed)
Anesthesia Evaluation  Patient identified by MRN, date of birth, ID band Patient awake    Reviewed: Allergy & Precautions, NPO status , Patient's Chart, lab work & pertinent test results  Airway Mallampati: II       Dental  (+) Teeth Intact   Pulmonary neg pulmonary ROS,    breath sounds clear to auscultation       Cardiovascular Exercise Tolerance: Good  Rhythm:Regular Rate:Normal     Neuro/Psych negative neurological ROS  negative psych ROS   GI/Hepatic negative GI ROS, Neg liver ROS,   Endo/Other  negative endocrine ROS  Renal/GU negative Renal ROS     Musculoskeletal negative musculoskeletal ROS (+)   Abdominal (+) + obese,   Peds negative pediatric ROS (+)  Hematology negative hematology ROS (+)   Anesthesia Other Findings   Reproductive/Obstetrics                             Anesthesia Physical Anesthesia Plan  ASA: II  Anesthesia Plan: General   Post-op Pain Management:    Induction: Intravenous  Airway Management Planned: Oral ETT  Additional Equipment:   Intra-op Plan:   Post-operative Plan: Extubation in OR  Informed Consent: I have reviewed the patients History and Physical, chart, labs and discussed the procedure including the risks, benefits and alternatives for the proposed anesthesia with the patient or authorized representative who has indicated his/her understanding and acceptance.     Plan Discussed with: CRNA  Anesthesia Plan Comments:         Anesthesia Quick Evaluation

## 2016-06-13 NOTE — ED Notes (Signed)
Pt states he took tylenol this AM at 0730

## 2016-06-13 NOTE — Transfer of Care (Signed)
Immediate Anesthesia Transfer of Care Note  Patient: Troy Garcia  Procedure(s) Performed: Procedure(s) with comments: LAPAROSCOPIC CHOLECYSTECTOMY (N/A) - hand assisted  Patient Location: PACU  Anesthesia Type:General  Level of Consciousness: sedated  Airway & Oxygen Therapy: Patient Spontanous Breathing and Patient connected to face mask oxygen  Post-op Assessment: Report given to RN and Post -op Vital signs reviewed and stable  Post vital signs: Reviewed and stable  Last Vitals:  Vitals:   06/13/16 1352 06/13/16 1353  BP:  138/74  Pulse: 90   Resp: 18   Temp: 37 C     Last Pain:  Vitals:   06/13/16 1352  TempSrc: Tympanic  PainSc: 0-No pain         Complications: No apparent anesthesia complications

## 2016-06-13 NOTE — Anesthesia Postprocedure Evaluation (Signed)
Anesthesia Post Note  Patient: Troy Garcia  Procedure(s) Performed: Procedure(s) (LRB): LAPAROSCOPIC CHOLECYSTECTOMY (N/A)  Patient location during evaluation: PACU Anesthesia Type: General Level of consciousness: awake and alert Pain management: pain level controlled Vital Signs Assessment: post-procedure vital signs reviewed and stable Respiratory status: spontaneous breathing, nonlabored ventilation, respiratory function stable and patient connected to nasal cannula oxygen Cardiovascular status: blood pressure returned to baseline and stable Postop Assessment: no signs of nausea or vomiting Anesthetic complications: no     Last Vitals:  Vitals:   06/13/16 1817 06/13/16 1832  BP:  (!) 141/89  Pulse: 92 91  Resp: 20 15  Temp: 36.6 C 36.8 C    Last Pain:  Vitals:   06/13/16 1832  TempSrc: Oral  PainSc:                  Cleda Mccreedy Murice Barbar

## 2016-06-13 NOTE — Op Note (Signed)
Laparoscopic Cholecystectomy  Pre-operative Diagnosis: cholecystitis  Post-operative Diagnosis: same  Procedure:  Hand assisted laparoscopic cholecystectomy with extensive Lysis of adhesions( taking at least 60 minutes of total operative time)  Surgeon: Sterling Big, MD FACS  Anesthesia: Gen. with endotracheal tube  Findings: Acute gangrenous Cholecystitis   Estimated Blood Loss: 150cc         Drains: 19 FR RUQ         Specimens: Gallbladder           Complications: none  Procedure Details  The patient was seen again in the Holding Room. The benefits, complications, treatment options, and expected outcomes were discussed with the patient. The risks of bleeding, infection, recurrence of symptoms, failure to resolve symptoms, bile duct damage, bile duct leak, retained common bile duct stone, bowel injury, any of which could require further surgery and/or ERCP, stent, or papillotomy were reviewed with the patient. The likelihood of improving the patient's symptoms with return to their baseline status is good.  The patient and/or family concurred with the proposed plan, giving informed consent.  The patient was taken to Operating Room, identified as Troy Garcia and the procedure verified as Laparoscopic Cholecystectomy.  A Time Out was held and the above information confirmed.  Prior to the induction of general anesthesia, antibiotic prophylaxis was administered. VTE prophylaxis was in place. General endotracheal anesthesia was then administered and tolerated well. After the induction, the abdomen was prepped with Chloraprep and draped in the sterile fashion. The patient was positioned in the supine position.  Cut down technique was used to enter the abdominal cavity and a Hasson trochar was placed after two vicryl stitches were anchored to the fascia. Pneumoperitoneum was then created with CO2 and tolerated well without any adverse changes in the patient's vital signs.  Three 5-mm  ports were placed in the right upper quadrant all under direct vision. All skin incisions  were infiltrated with a local anesthetic agent before making the incision and placing the trocars.   The patient was positioned  in reverse Trendelenburg, tilted slightly to the patient's left.  The omentum was plastered and attached to the gallbladder fossa. There was dense adhesions from the omentum to the gallbladder also from the omentum to the liver and from the duodenum to the gallbladder. Extensive lysis of adhesions was performed with meticulous dissection and meticulous attention not to injure any small bowel or vital structures. At the lysis of adhesions. Non-with a combination of blunt dissection and electrocautery.   The gallbladder was identified, the fundus grasped and retracted cephalad. Further Adhesions were lysed bluntly. The significant inflammatory response around the infundibulum of the gallbladder and because of the patient's body habitus the closure was difficult. A fifth port was placed to push down on the duodenum and to have better exposure. However again the exposure remained very difficult and because of this I decided to convert to a hand-assisted case. I extended my periumbilical incision in the standard fashion and the GelPort was placed. Once my hand was inside the abdomen out was finally able to obtain good exposure and a good dissection window. I was able to do at Down technique to remove the gallbladder from its fossa.  The infundibulum was grasped and retracted laterally, exposing the peritoneum overlying the triangle of Calot. This was then divided and exposed in a blunt fashion. An extended critical view of the cystic duct and cystic artery was obtained.  The cystic duct was clearly identified and bluntly dissected.  Artery and duct were double clipped and divided.  The gallbladder was removed and placed in an Endocatch bag. The liver bed was irrigated and inspected. Hemostasis  was achieved with the electrocautery. Copious irrigation was utilized and was repeatedly aspirated until clear.  The gallbladder and Endocatch sac were then removed through the gelport site.  19 FR blake drain placed in the GB fossa and secured w 2 -0 silks. Exparel injected along all incisions sites.  Inspection of the right upper quadrant was performed. No bleeding, bile duct injury or leak, or bowel injury was noted. Pneumoperitoneum was released.  The fascia was closed with 0 PDS. 4-0 subcuticular Monocryl was used to close the skin. Dermabond was  applied.  The patient was then extubated and brought to the recovery room in stable condition. Sponge, lap, and needle counts were correct at closure and at the conclusion of the case.               Sterling Big, MD, FACS

## 2016-06-13 NOTE — ED Notes (Signed)
Patient transported to Ultrasound 

## 2016-06-13 NOTE — ED Notes (Signed)
Orderly to room to transport patient for surgery.

## 2016-06-13 NOTE — H&P (Signed)
Patient ID: Troy Garcia, male   DOB: 05/16/1975, 41 y.o.   MRN: [id number]  HPI Troy Garcia is a 41 y.o. male with a history of right upper quadrant pain that started last night after eating pizza. He reports the pain is sharp and severe and is constant. Pain is related to his back. As stated nausea. No fevers no chills no shortness of breath. No evidence of obstructive jaundice. Of note he had a similar episode a year ago but subsided. The ER he had a low-grade temperature his white count was 14,000 LFTs were normal. Ultrasound I have personally reviewed there is evidence of multiple gallstones normal common bile duct . He has good cardiovascular reserve and is able to perform more than 4 Mets without shortness of breath or chest pain. No previous abdominal operations. He is morbidly obese  HPI  History reviewed. No pertinent past medical history.  History reviewed. No pertinent surgical history.  History reviewed. No pertinent family history.  Social History Social History  Substance Use Topics  . Smoking status: Never Smoker  . Smokeless tobacco: Never Used  . Alcohol use No    No Known Allergies  Current Facility-Administered Medications  Medication Dose Route Frequency Provider Last Rate Last Dose  . piperacillin-tazobactam (ZOSYN) IVPB 3.375 g  3.375 g Intravenous Once Arnaldo Natal, MD      . sodium chloride 0.9 % bolus 1,000 mL  1,000 mL Intravenous Once Leafy Ro, MD       Current Outpatient Prescriptions  Medication Sig Dispense Refill  . acetaminophen (TYLENOL) 500 MG tablet Take 500 mg by mouth every 6 (six) hours as needed.    Marland Kitchen ibuprofen (ADVIL,MOTRIN) 200 MG tablet Take 200 mg by mouth every 6 (six) hours as needed.    Marland Kitchen oxyCODONE-acetaminophen (ROXICET) 5-325 MG tablet Take 1 tablet by mouth every 4 (four) hours as needed for severe pain. 20 tablet 0  . ondansetron (ZOFRAN ODT) 4 MG disintegrating tablet Take 1 tablet (4 mg total) by mouth every 8  (eight) hours as needed for nausea or vomiting. (Patient not taking: Reported on 06/13/2016) 20 tablet 0     Review of Systems Full ROS  was asked and was negative except for the information on the HPI  Physical Exam Blood pressure 138/88, pulse 98, temperature 98.7 F (37.1 C), temperature source Oral, resp. rate 18, height  (1.778 m), weight 121.1 kg (267 lb), SpO2 97 %. CONSTITUTIONAL: obese male NAD EYES: Pupils are equal, round, and reactive to light, Sclera are non-icteric. EARS, NOSE, MOUTH AND THROAT: The oropharynx is clear. The oral mucosa is pink and moist. Hearing is intact to voice. LYMPH NODES:  Lymph nodes in the neck are normal. RESPIRATORY:  Lungs are clear. There is normal respiratory effort, with equal breath sounds bilaterally, and without pathologic use of accessory muscles. CARDIOVASCULAR: Heart is regular without murmurs, gallops, or rubs. GI: The abdomen is  soft,TTP RUQ w + murphy sign. No peritonitis GU: Rectal deferred.   MUSCULOSKELETAL: Normal muscle strength and tone. No cyanosis or edema.   SKIN: Turgor is good and there are no pathologic skin lesions or ulcers. NEUROLOGIC: Motor and sensation is grossly normal. Cranial nerves are grossly intact. PSYCH:  Oriented to person, place and time. Affect is normal.  Data Reviewed  I have personally reviewed the patient's imaging, laboratory findings and medical records.    Assessment Plan  41 year old obese male with classic signs and symptoms consistent with acute  cholecystitis in need for cholecystectomy. Discussed with the patient in detail about his disease process and I do recommend cholecystectomy. An we'll go ahead and start him on IV antibiotics given, fluid bolus and schedule him for a prompt cholecystectomy.The risks, benefits, complications, treatment options, and expected outcomes were discussed with the patient. The possibilities of bleeding, recurrent infection, finding a normal gallbladder,  perforation of viscus organs, damage to surrounding structures, bile leak, abscess formation, needing a drain placed, the need for additional procedures, reaction to medication, pulmonary aspiration,  failure to diagnose a condition, the possible need to convert to an open procedure, and creating a complication requiring transfusion or operation were discussed with the patient. The patient and/or family concurred with the proposed plan, giving informed consent.   Sterling Big, MD FACS General Surgeon 06/13/2016, 12:57 PM

## 2016-06-13 NOTE — ED Triage Notes (Signed)
States RUQ abd pain that began last night at 11pm after eating pizza, states he has been told he needed to get his gallbladder out but was told to wait until symptoms return, states chills, awake and alert in no acute distress

## 2016-06-13 NOTE — Anesthesia Procedure Notes (Signed)
Procedure Name: Intubation Date/Time: 06/13/2016 2:56 PM Performed by: Johnna Acosta Pre-anesthesia Checklist: Patient identified, Emergency Drugs available, Suction available, Patient being monitored and Timeout performed Patient Re-evaluated:Patient Re-evaluated prior to inductionOxygen Delivery Method: Circle system utilized Preoxygenation: Pre-oxygenation with 100% oxygen Intubation Type: IV induction Ventilation: Mask ventilation without difficulty Laryngoscope Size: Mac and 4 Grade View: Grade I Tube type: Oral Tube size: 7.5 mm Number of attempts: 1 Airway Equipment and Method: Stylet Placement Confirmation: ETT inserted through vocal cords under direct vision,  positive ETCO2 and breath sounds checked- equal and bilateral Secured at: 21 cm Tube secured with: Tape Dental Injury: Teeth and Oropharynx as per pre-operative assessment

## 2016-06-14 ENCOUNTER — Encounter: Payer: Self-pay | Admitting: Surgery

## 2016-06-14 MED ORDER — HYDROCODONE-ACETAMINOPHEN 5-325 MG PO TABS: 1.0000 | ORAL_TABLET | ORAL | 0 refills | Status: DC | PRN

## 2016-06-14 MED ORDER — AMOXICILLIN-POT CLAVULANATE 875-125 MG PO TABS: 1.0000 | ORAL_TABLET | Freq: Two times a day (BID) | ORAL | 0 refills | Status: DC

## 2016-06-14 NOTE — Progress Notes (Signed)
Pt d/c to home today.  JP drain to remain intact.  Education provided on how to empty and record output.  Education provided on infection prevention.  IV removed intact.  Rx's given to pt w/all questions and concerns addressed.  D/C paperwork reviewed and education provided with all questions and concerns addressed.  Pt family at bedside for home transport.

## 2016-06-14 NOTE — Discharge Summary (Signed)
Patient ID: Troy Garcia MRN: [id number] DOB/AGE: 41/22/1977 41 y.o.  Admit date: 06/13/2016 Discharge date: 06/14/2016   Discharge Diagnoses:  Active Problems:   Cholecystitis, acute   Procedures:Hand assisted laparoscopic cholecystectomy  Hospital Course: 41 year old obese male admitted to the hospital with fevers and right upper quadrant pain clinically consistent with acute cholecystitis and confirmed by ultrasound. Patient was started antibiotics and was promptly taken to the operative room for a cholecystectomy. Because of his obesity and the degree of inflammation I had to perform a hand-assisted case and place a drain. He did very well postoperatively and at time of discharge she was ambulating, tolerating regular diet. He was afebrile his vital signs were stable. His physical exam showed male in no acute distress, awake and alert. Abdomen: Incisions healing well without evidence of infection. No peritonitis. She is active in his drainage from the JP. Extremities: No edema. We will give him a short course of antibiotic therapy as an outpatient given his infection of the gallbladder. He'll have a follow-up appointment next week in my office. Condition at discharge is stable    Disposition: 01-Home or Self Care  Discharge Instructions    Call MD for:  difficulty breathing, headache or visual disturbances    Complete by:  As directed    Call MD for:  extreme fatigue    Complete by:  As directed    Call MD for:  hives    Complete by:  As directed    Call MD for:  persistant dizziness or light-headedness    Complete by:  As directed    Call MD for:  persistant nausea and vomiting    Complete by:  As directed    Call MD for:  redness, tenderness, or signs of infection (pain, swelling, redness, odor or green/yellow discharge around incision site)    Complete by:  As directed    Call MD for:  severe uncontrolled pain    Complete by:  As directed    Call MD for:  temperature  >100.4    Complete by:  As directed    Diet - low sodium heart healthy    Complete by:  As directed    Discharge instructions    Complete by:  As directed    Please teach pt about JP care. . Shower in am   Discharge wound care:    Complete by:  As directed    Change JP dressing QOD   Increase activity slowly    Complete by:  As directed    Lifting restrictions    Complete by:  As directed    20 lbs x 6 wks     Allergies as of 06/14/2016   No Known Allergies     Medication List    STOP taking these medications   oxyCODONE-acetaminophen 5-325 MG tablet Commonly known as:  ROXICET     TAKE these medications   acetaminophen 500 MG tablet Commonly known as:  TYLENOL Take 500 mg by mouth every 6 (six) hours as needed.   amoxicillin-clavulanate 875-125 MG tablet Commonly known as:  AUGMENTIN Take 1 tablet by mouth 2 (two) times daily.   HYDROcodone-acetaminophen 5-325 MG tablet Commonly known as:  NORCO Take 1-2 tablets by mouth every 4 (four) hours as needed for moderate pain.   ibuprofen 200 MG tablet Commonly known as:  ADVIL,MOTRIN Take 200 mg by mouth every 6 (six) hours as needed.   ondansetron 4 MG disintegrating tablet Commonly known as:  ZOFRAN  ODT Take 1 tablet (4 mg total) by mouth every 8 (eight) hours as needed for nausea or vomiting.      Follow-up Information    Leafy Ro, MD. Go on 06/21/2016.   Specialty:  General Surgery Why:  Thursday at 11:15am for hospital follow-up Contact information: [address] [place] [address] [phone]            Sterling Big, MD FACS

## 2016-06-14 NOTE — Care Management (Signed)
Patient admitted status post laparoscopic cholecystectomy .  Patient self pay.  States he does not have a PCP.  Provided application to medication management and [facility].  Patient provided coupon from [employer] for pain medication.  RNCM signing off

## 2016-06-15 LAB — HIV ANTIBODY (ROUTINE TESTING W REFLEX): HIV Screen 4th Generation wRfx: NONREACTIVE

## 2016-06-15 LAB — SURGICAL PATHOLOGY

## 2016-06-21 ENCOUNTER — Ambulatory Visit (INDEPENDENT_AMBULATORY_CARE_PROVIDER_SITE_OTHER): Payer: Medicaid Other | Admitting: Surgery

## 2016-06-21 ENCOUNTER — Encounter: Payer: Self-pay | Admitting: Surgery

## 2016-06-21 VITALS — BP 129/83 | HR 93 | Temp 98.1°F | Ht 70.0 in | Wt 263.0 lb

## 2016-06-21 DIAGNOSIS — Z09 Encounter for follow-up examination after completed treatment for conditions other than malignant neoplasm: Secondary | ICD-10-CM

## 2016-06-21 NOTE — Patient Instructions (Signed)
We removed your drain today. Please keep a dressing over the drain site until it closes completely.  No lifting over 15 pounds until 07/24/16. You may resume your normal activities after 07/24/16. Please finish your antibiotic. Please call our office if you have questions or concerns.

## 2016-06-21 NOTE — Progress Notes (Signed)
Status post hand-assisted  Lap cholecystectomy. 4/25 Is doing very well without any concerns.  draining less than 30-20 cc of serous fluid from the JP. No fevers or chills. Taking by mouth  PE NAD Abd: soft, Nt, incisions c/d/I, no infection or peritonitis. JP serous fluid removed  A/P DOing well No complications No heavy lifting F/U prn

## 2018-02-05 IMAGING — US US ABDOMEN LIMITED
1 series · 14 of 25 positions shown · non-contrast
Comparison: None.

CLINICAL DATA: 40 y/o M; right upper quadrant and epigastric pain
for several hours.

EXAM:
US ABDOMEN LIMITED - RIGHT UPPER QUADRANT

[Series 1: us abdomen limited · 0.25mm/px · 14 of 43 slices shown]
[im 1/43]
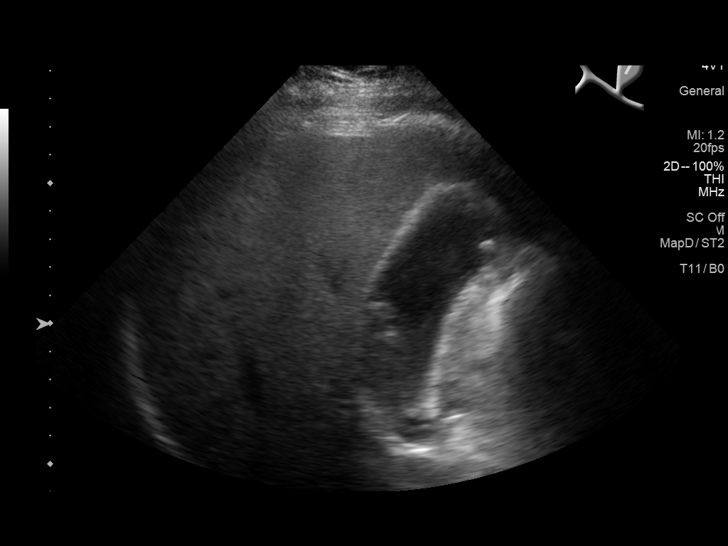
[im 4/43]
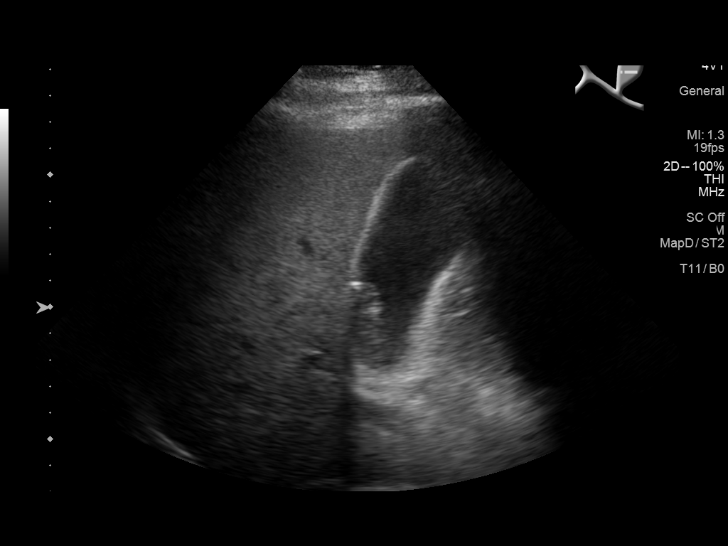
[im 8/43]
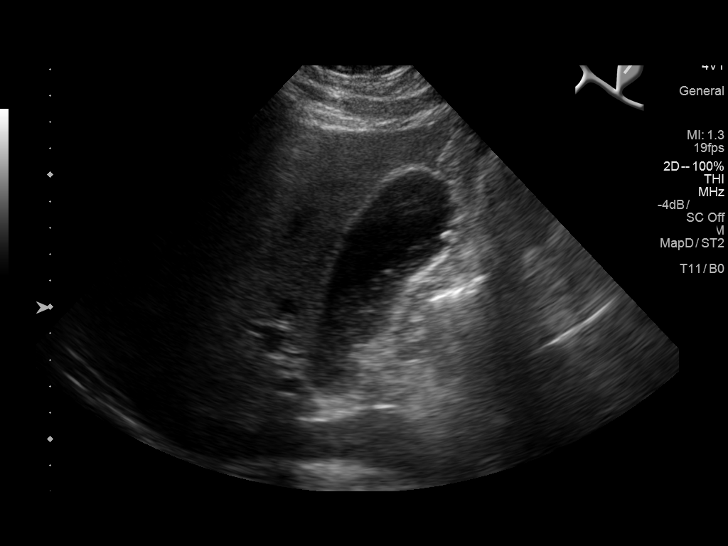
[im 11/43]
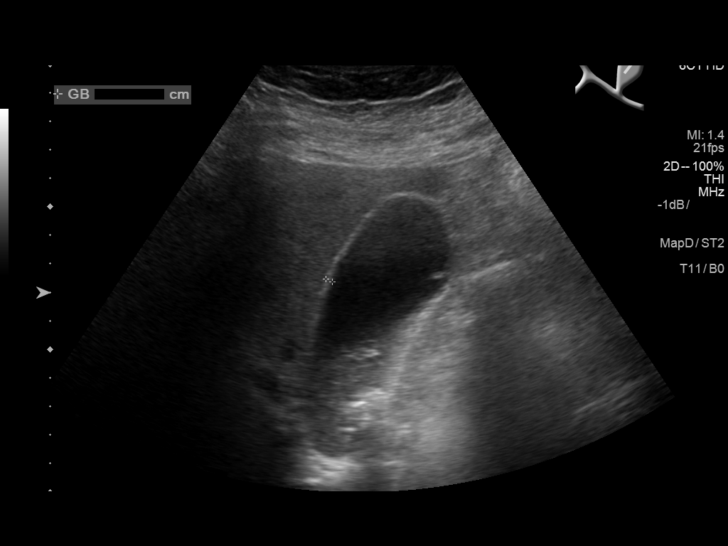
[im 15/43]
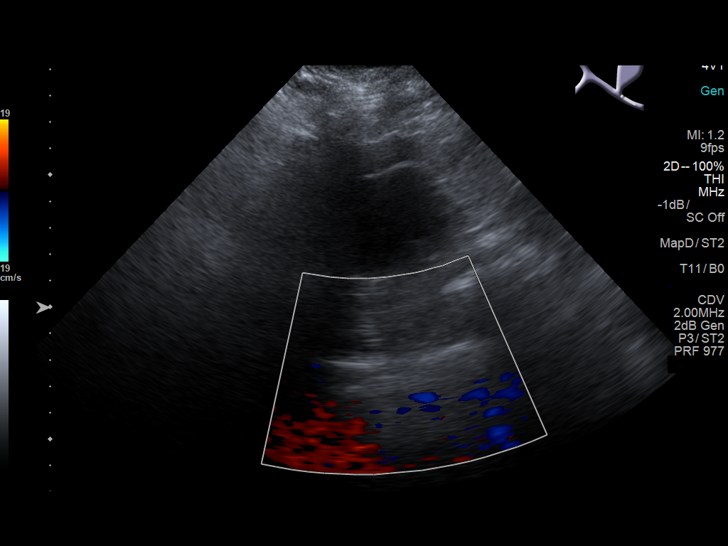
[im 16/43]
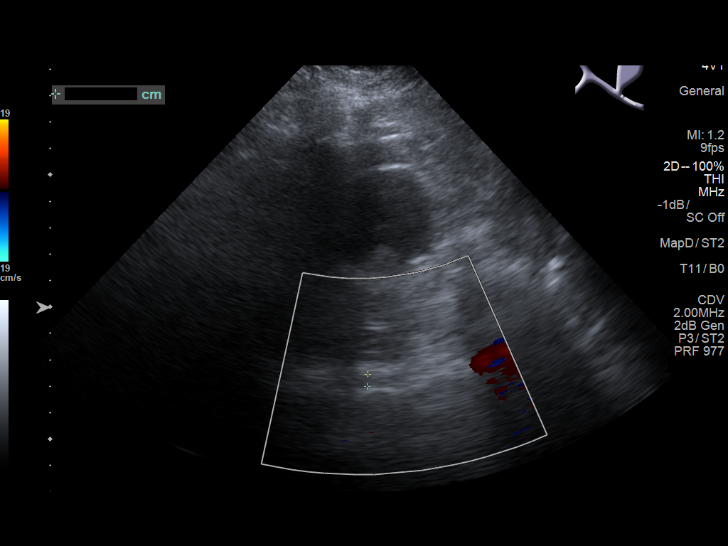
[im 20/43]
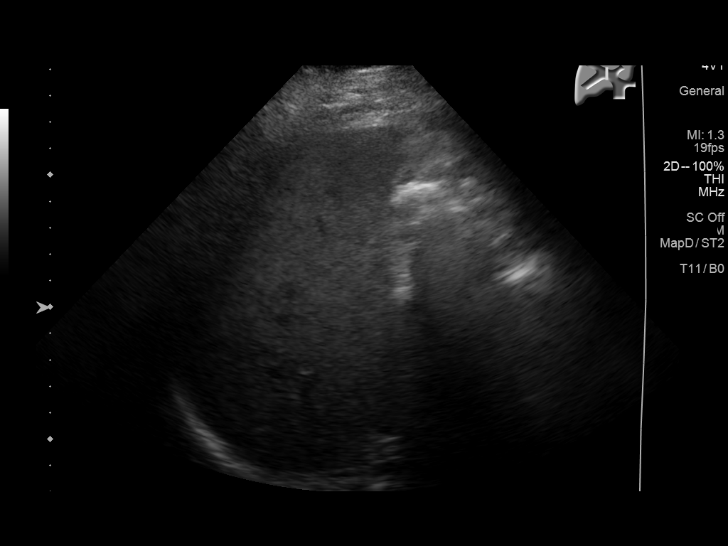
[im 23/43]
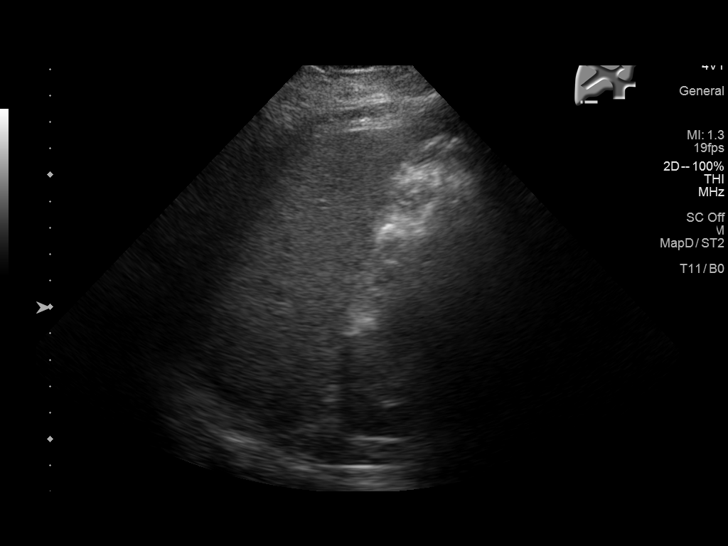
[im 27/43]
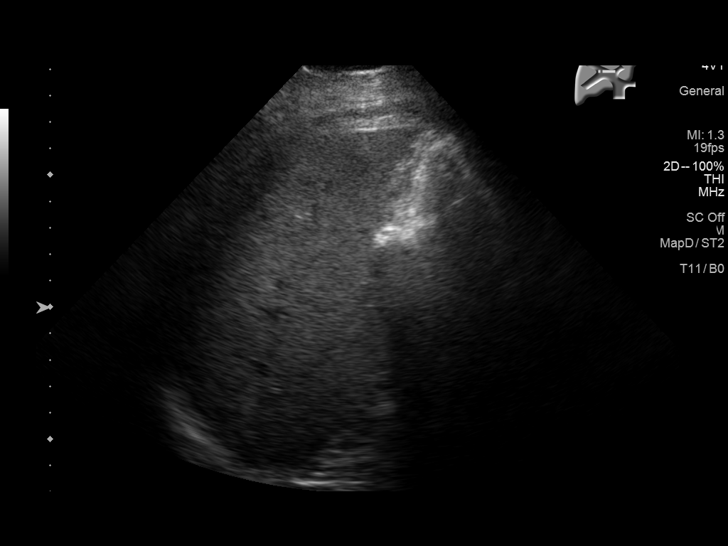
[im 29/43]
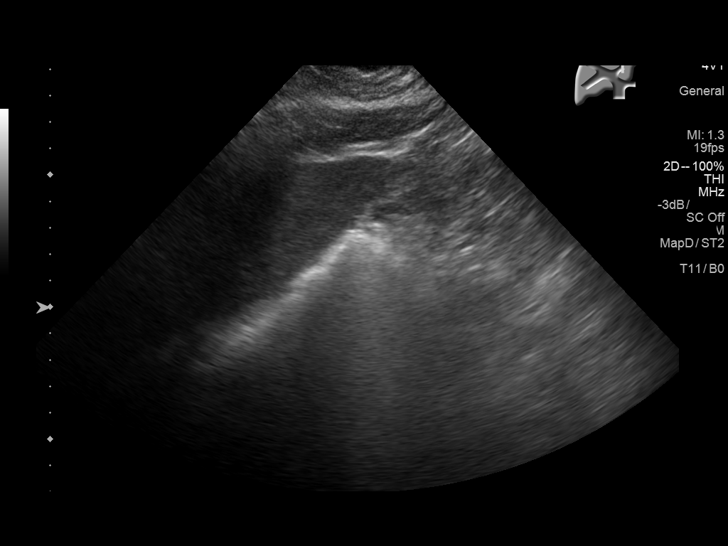
[im 32/43]
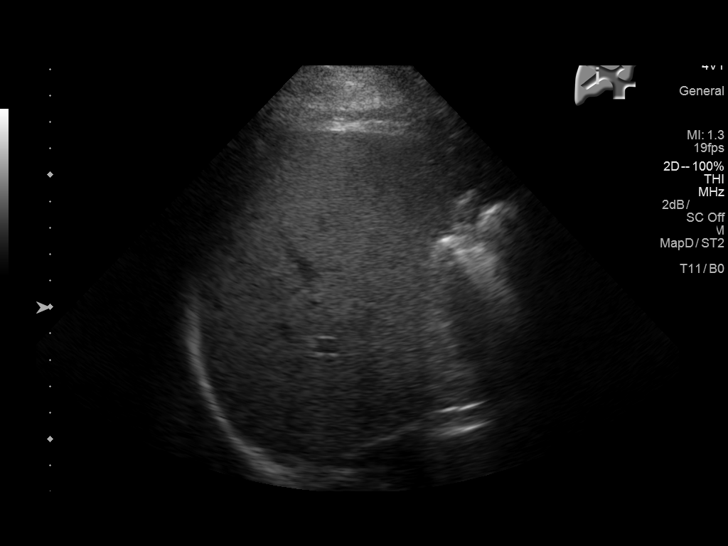
[im 36/43]
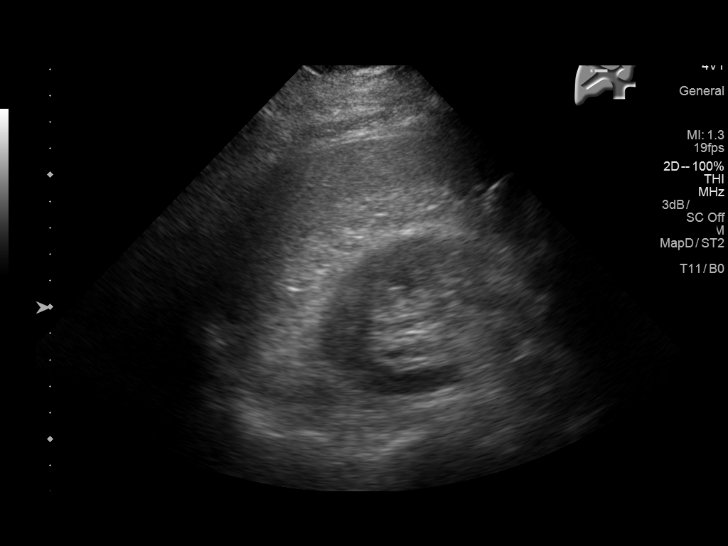
[im 39/43]
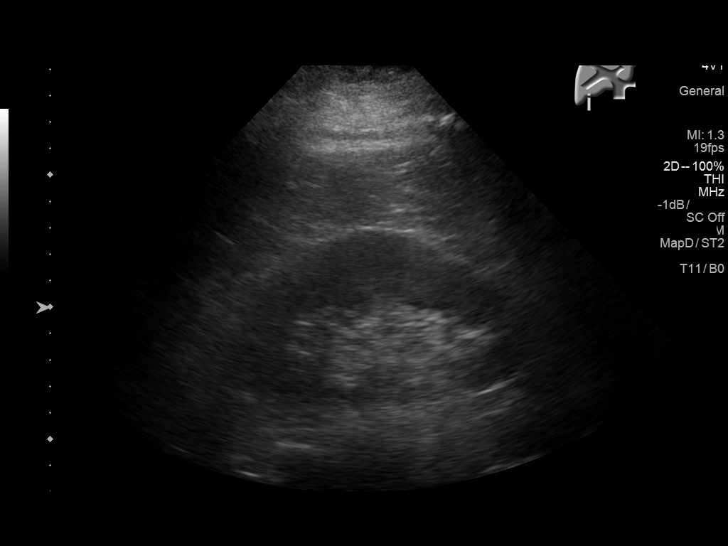
[im 43/43]
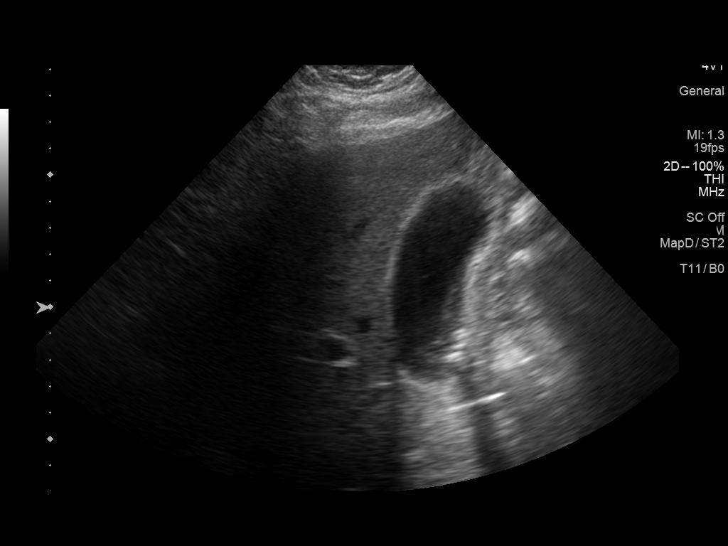

[14 of 25 positions shown; findings below may reference images not displayed]

FINDINGS: Gallbladder:

Mobile stones and sludge with the largest calculus measuring 7.5 mm.
Negative sonographic Murphy's sign. No gallbladder wall thickening.
No pericholecystic fluid.

Common bile duct:

Diameter: 4.6 mm

Liver:

No focal lesion identified. Within normal limits in parenchymal
echogenicity.
IMPRESSION: Gallstones and gallbladder sludge. No secondary signs of
cholecystitis.

By: Miora Zamouri M.D.

## 2018-07-07 IMAGING — US US ABDOMEN LIMITED
1 series · 14 of 25 positions shown · non-contrast
Comparison: 01/13/2016 .

CLINICAL DATA: Right upper quadrant pain.

EXAM:
US ABDOMEN LIMITED - RIGHT UPPER QUADRANT

[Series 1: us abdomen limited · 0.28mm/px · 14 of 43 slices shown]
[im 1/43]
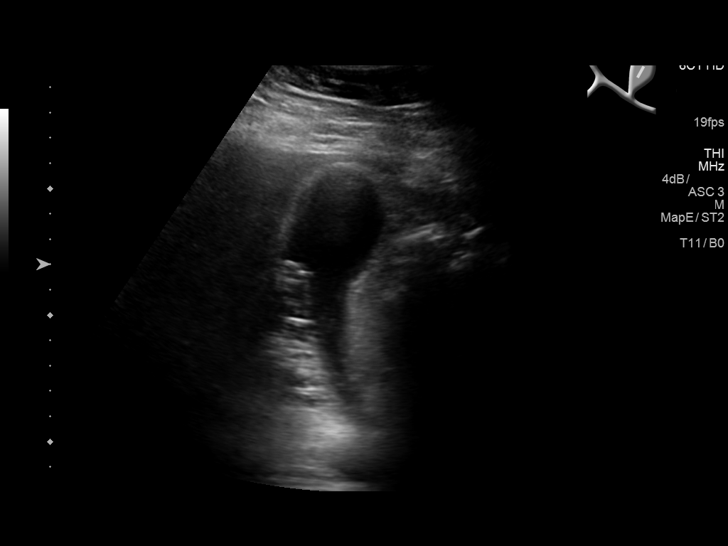
[im 4/43]
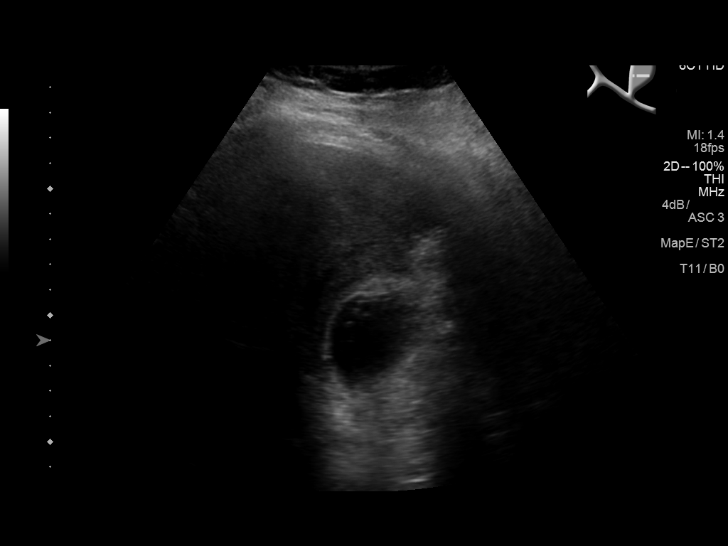
[im 8/43]
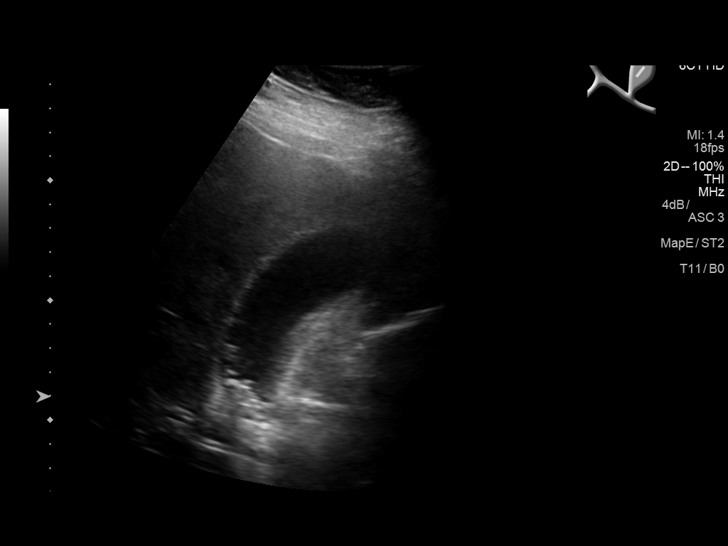
[im 11/43]
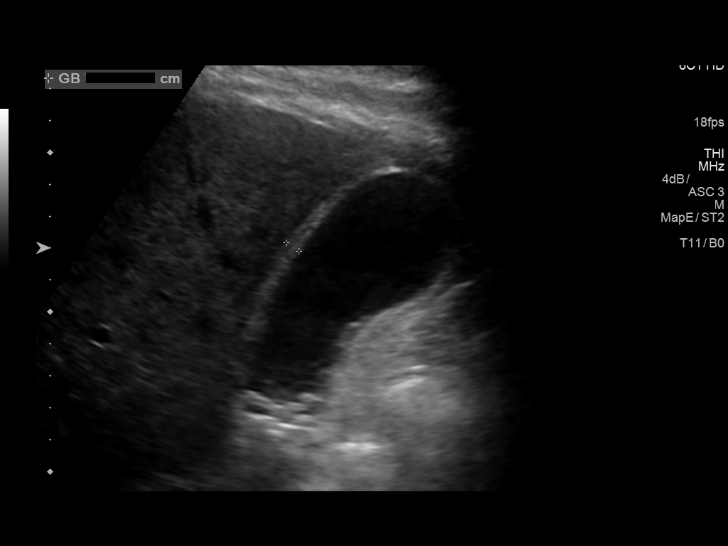
[im 15/43]
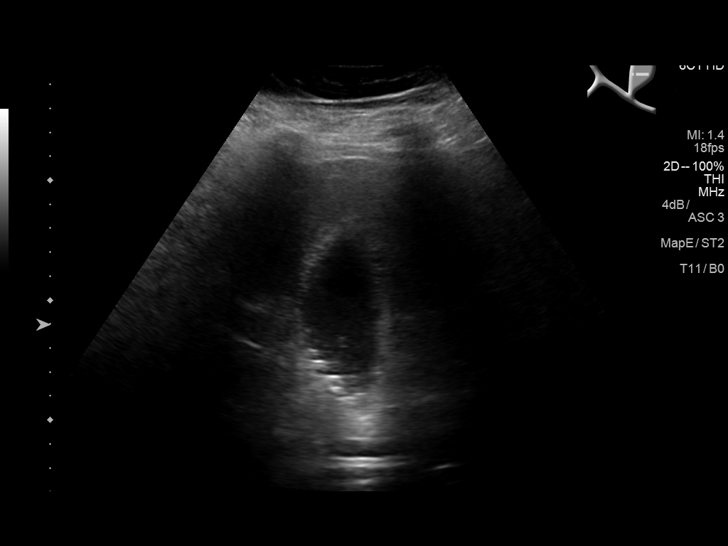
[im 16/43]
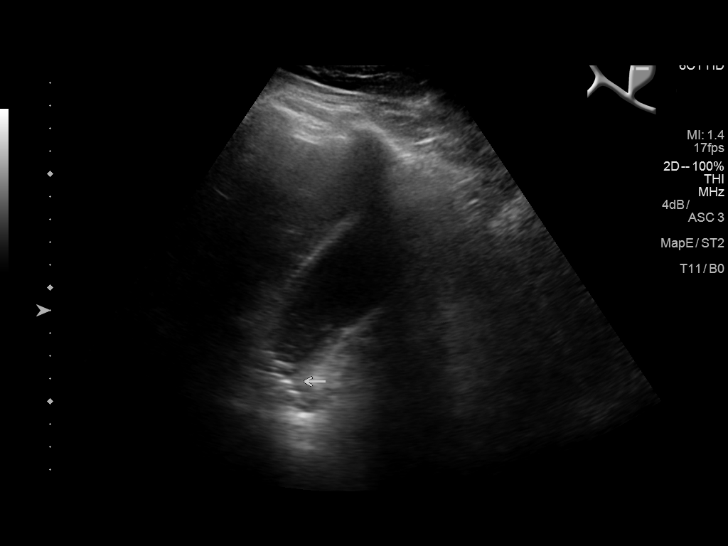
[im 20/43]
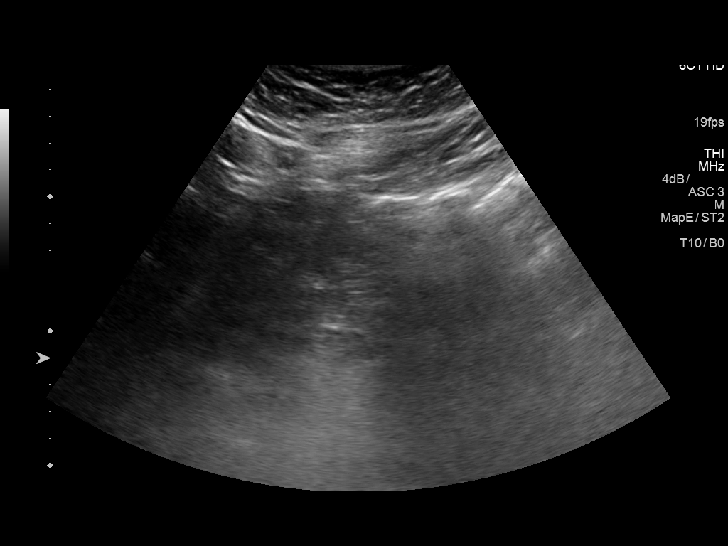
[im 23/43]
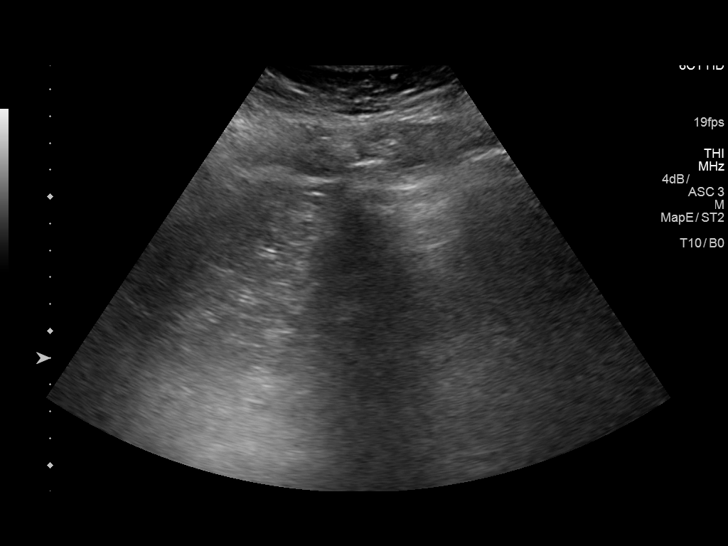
[im 27/43]
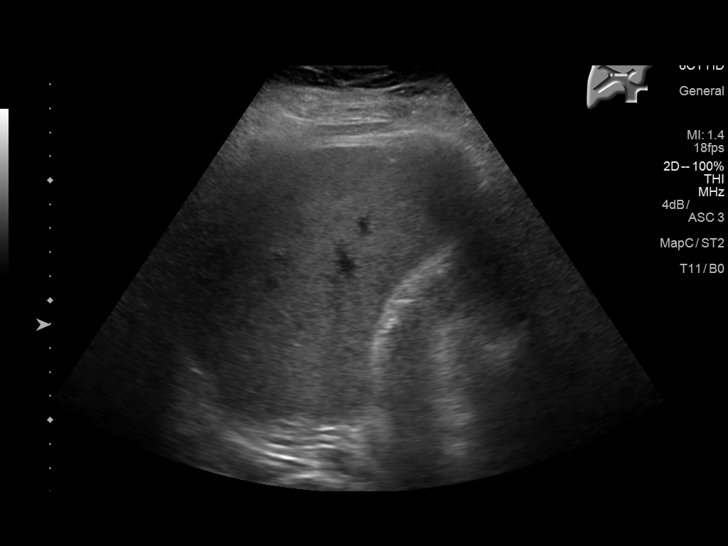
[im 29/43]
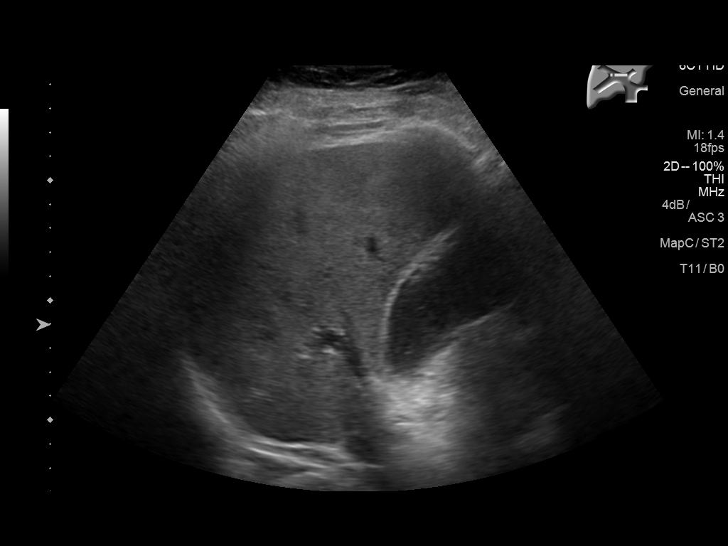
[im 32/43]
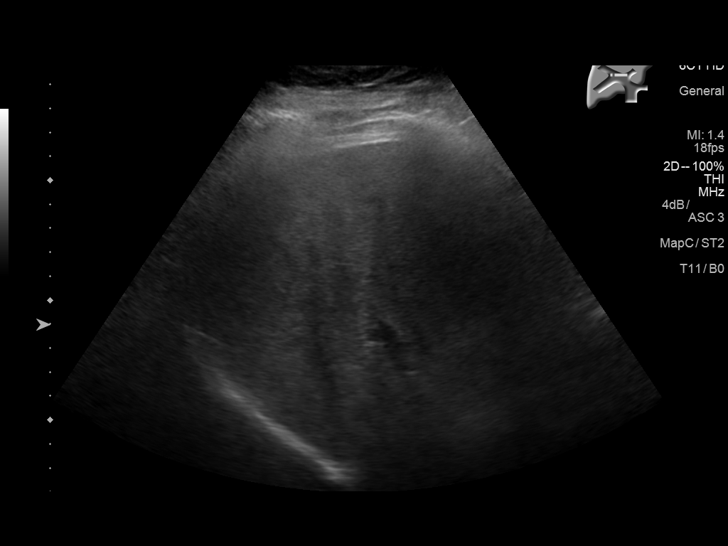
[im 36/43]
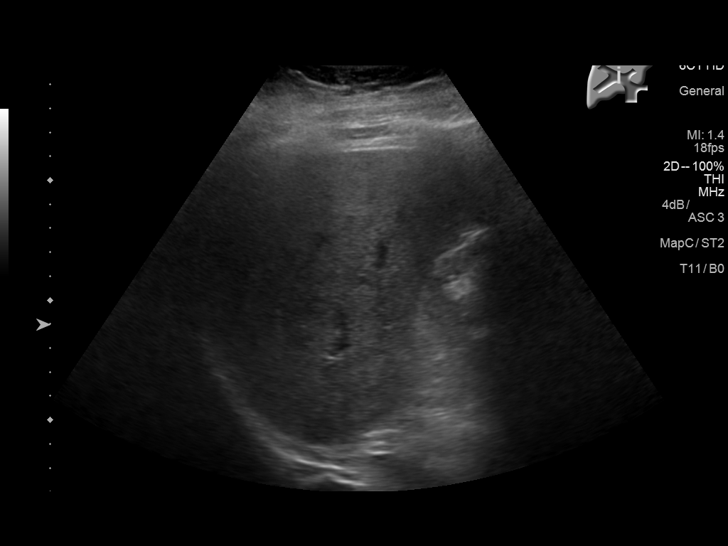
[im 39/43]
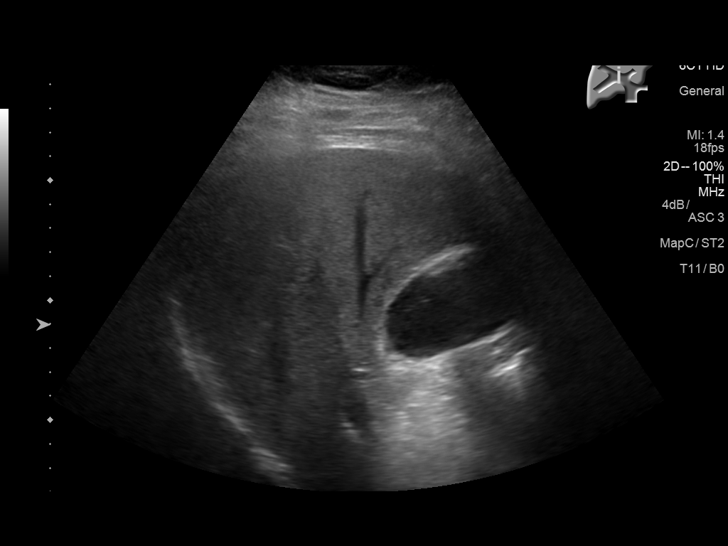
[im 43/43]
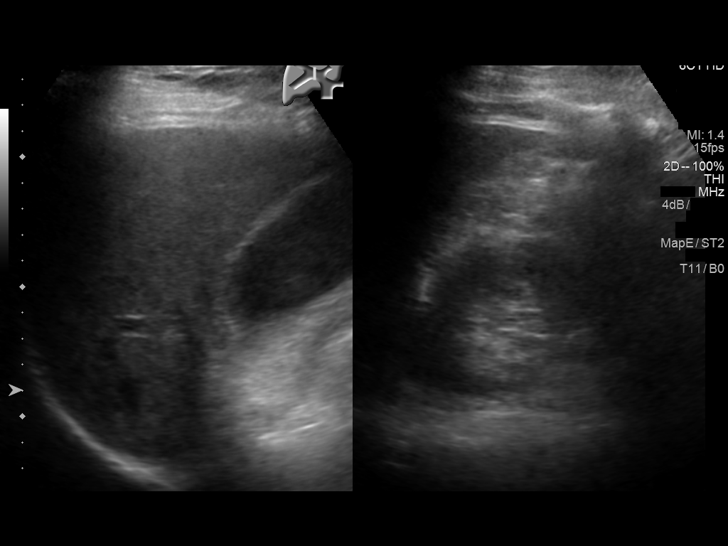

[14 of 25 positions shown; findings below may reference images not displayed]

FINDINGS: Gallbladder:

Multiple non mobile stones measuring up 8.4 mm are noted.
Gallbladder wall thickness 4.8 mm. Cholecystitis cannot be excluded.
Negative Murphy sign.

Common bile duct:

Diameter: 4.5 mm

Liver:

Slight increase echogenicity consistent fatty infiltration and/or
hepatocellular disease.
IMPRESSION: 1. Multiple gallstones measuring up to 8.4 mm. Gallbladder wall is
thickened at 4.8 mm. Cholecystitis cannot be excluded. Negative
Murphy sign. No biliary distention.

2. Mild increased hepatic echogenicity consistent fatty infiltration
and/or hepatocellular disease.

## 2018-12-26 ENCOUNTER — Other Ambulatory Visit: Payer: Self-pay | Admitting: *Deleted

## 2018-12-26 DIAGNOSIS — Z20822 Contact with and (suspected) exposure to covid-19: Secondary | ICD-10-CM

## 2018-12-28 LAB — NOVEL CORONAVIRUS, NAA: SARS-CoV-2, NAA: NOT DETECTED

## 2019-01-14 ENCOUNTER — Other Ambulatory Visit: Payer: Self-pay

## 2019-01-14 DIAGNOSIS — Z20822 Contact with and (suspected) exposure to covid-19: Secondary | ICD-10-CM

## 2019-01-16 LAB — NOVEL CORONAVIRUS, NAA: SARS-CoV-2, NAA: NOT DETECTED

## 2019-03-18 ENCOUNTER — Ambulatory Visit: Payer: Self-pay

## 2022-05-01 DIAGNOSIS — I10 Essential (primary) hypertension: Secondary | ICD-10-CM | POA: Insufficient documentation

## 2022-11-23 DIAGNOSIS — Z0000 Encounter for general adult medical examination without abnormal findings: Secondary | ICD-10-CM | POA: Insufficient documentation

## 2022-12-21 ENCOUNTER — Emergency Department: Payer: Managed Care, Other (non HMO)

## 2022-12-21 ENCOUNTER — Other Ambulatory Visit: Payer: Self-pay

## 2022-12-21 ENCOUNTER — Encounter: Payer: Self-pay | Admitting: Emergency Medicine

## 2022-12-21 ENCOUNTER — Emergency Department
Admission: EM | Admit: 2022-12-21 | Discharge: 2022-12-22 | Disposition: A | Discharge status: Home / Self Care | Payer: Managed Care, Other (non HMO) | Attending: Emergency Medicine | Admitting: Emergency Medicine

## 2022-12-21 DIAGNOSIS — R197 Diarrhea, unspecified: Secondary | ICD-10-CM | POA: Insufficient documentation

## 2022-12-21 DIAGNOSIS — R110 Nausea: Secondary | ICD-10-CM | POA: Diagnosis not present

## 2022-12-21 DIAGNOSIS — R1011 Right upper quadrant pain: Secondary | ICD-10-CM | POA: Diagnosis present

## 2022-12-21 DIAGNOSIS — D72829 Elevated white blood cell count, unspecified: Secondary | ICD-10-CM | POA: Diagnosis not present

## 2022-12-21 LAB — CBC
HCT: 43 % (ref 39.0–52.0)
Hemoglobin: 14.2 g/dL (ref 13.0–17.0)
MCH: 30.1 pg (ref 26.0–34.0)
MCHC: 33 g/dL (ref 30.0–36.0)
MCV: 91.1 fL (ref 80.0–100.0)
Platelets: 391 10*3/uL (ref 150–400)
RBC: 4.72 MIL/uL (ref 4.22–5.81)
RDW: 11.3 % — ABNORMAL LOW (ref 11.5–15.5)
WBC: 10.6 10*3/uL — ABNORMAL HIGH (ref 4.0–10.5)
nRBC: 0 % (ref 0.0–0.2)

## 2022-12-21 LAB — URINALYSIS, ROUTINE W REFLEX MICROSCOPIC
Bacteria, UA: NONE SEEN
Bilirubin Urine: NEGATIVE
Glucose, UA: NEGATIVE mg/dL
Hgb urine dipstick: NEGATIVE
Ketones, ur: 5 mg/dL — AB
Leukocytes,Ua: NEGATIVE
Nitrite: NEGATIVE
Protein, ur: 30 mg/dL — AB
Specific Gravity, Urine: 1.03 (ref 1.005–1.030)
Squamous Epithelial / HPF: 0 /[HPF] (ref 0–5)
pH: 5 (ref 5.0–8.0)

## 2022-12-21 LAB — COMPREHENSIVE METABOLIC PANEL
ALT: 45 U/L — ABNORMAL HIGH (ref 0–44)
AST: 46 U/L — ABNORMAL HIGH (ref 15–41)
Albumin: 4.9 g/dL (ref 3.5–5.0)
Alkaline Phosphatase: 67 U/L (ref 38–126)
Anion gap: 14 (ref 5–15)
BUN: 13 mg/dL (ref 6–20)
CO2: 21 mmol/L — ABNORMAL LOW (ref 22–32)
Calcium: 9.3 mg/dL (ref 8.9–10.3)
Chloride: 101 mmol/L (ref 98–111)
Creatinine, Ser: 1.35 mg/dL — ABNORMAL HIGH (ref 0.61–1.24)
GFR, Estimated: >60 mL/min (ref >60)
Glucose, Bld: 176 mg/dL — ABNORMAL HIGH (ref 70–99)
Potassium: 3.9 mmol/L (ref 3.5–5.1)
Sodium: 136 mmol/L (ref 135–145)
Total Bilirubin: 1.4 mg/dL — ABNORMAL HIGH (ref 0.3–1.2)
Total Protein: 8.8 g/dL — ABNORMAL HIGH (ref 6.5–8.1)

## 2022-12-21 LAB — LIPASE, BLOOD: Lipase: 28 U/L (ref 11–51)

## 2022-12-21 MED ORDER — KETOROLAC TROMETHAMINE 30 MG/ML IJ SOLN
30.0000 mg | Freq: Once | INTRAMUSCULAR | Status: AC
  Administered 2022-12-22: 30 mg via INTRAVENOUS
  Filled 2022-12-21: qty 1

## 2022-12-21 MED ORDER — IOHEXOL 300 MG/ML  SOLN
100.0000 mL | Freq: Once | INTRAMUSCULAR | Status: AC | PRN
  Administered 2022-12-21: 100 mL via INTRAVENOUS

## 2022-12-21 MED ORDER — ONDANSETRON 4 MG PO TBDP
4.0000 mg | ORAL_TABLET | Freq: Once | ORAL | Status: AC
  Administered 2022-12-21: 4 mg via ORAL
  Filled 2022-12-21: qty 1

## 2022-12-21 MED ORDER — MORPHINE SULFATE (PF) 4 MG/ML IV SOLN
4.0000 mg | Freq: Once | INTRAVENOUS | Status: AC
  Administered 2022-12-22: 4 mg via INTRAVENOUS
  Filled 2022-12-21: qty 1

## 2022-12-21 NOTE — ED Provider Notes (Signed)
-----------------------------------------   11:08 PM on 12/21/2022 -----------------------------------------  Assuming care from Dr. Vicente Males.  In short, Troy Garcia is a 47 y.o. male with a chief complaint of abdominal pain and nausea.  Refer to the original H&P for additional details.  The current plan of care is to follow up CT.   Clinical Course as of 12/22/22 0445  Sat Dec 22, 2022  0157 CT ABDOMEN PELVIS W CONTRAST Viewed and interpreted CT abd/pelvis.  No obvious infectious process such as appendicitis.  Radiologist identified diarrheal illness.  Updated patient who is comfortable.  Gave usual/customary management recommendations and return precautions.  He understands and agrees. [CF]    Clinical Course User Index [CF] Loleta Rose, MD     Medications  ondansetron (ZOFRAN-ODT) disintegrating tablet 4 mg (4 mg Oral Given 12/21/22 1802)  iohexol (OMNIPAQUE) 300 MG/ML solution 100 mL (100 mLs Intravenous Contrast Given 12/21/22 2315)  ketorolac (TORADOL) 30 MG/ML injection 30 mg (30 mg Intravenous Given 12/22/22 0002)  morphine (PF) 4 MG/ML injection 4 mg (4 mg Intravenous Given 12/22/22 0002)     ED Discharge Orders          Ordered    ondansetron (ZOFRAN-ODT) 4 MG disintegrating tablet        12/22/22 0202           Final diagnoses:  Right upper quadrant abdominal pain  Nausea  Diarrhea, unspecified type     Loleta Rose, MD 12/22/22 [phone]

## 2022-12-21 NOTE — ED Provider Notes (Signed)
[facility] Provider Note   Event Date/Time   First MD Initiated Contact with Patient 12/21/22 2101     (approximate) History  Abdominal Pain and Nausea  HPI Troy Garcia is a 47 y.o. male with stated past medical history of cholecystectomy who presents complaining of epigastric and right upper quadrant abdominal pain after an episode of nausea earlier today.  Patient states that prior to this he was having diaphoresis and the feeling of flushing.  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  Patient endorses mild loose stools over the last couple days but denies any diarrhea.  Patient denies any subsequent episodes of nausea.  Patient denies any vomiting ROS: Patient currently denies any vision changes, tinnitus, difficulty speaking, facial droop, sore throat, chest pain, shortness of breath, vomiting/diarrhea, dysuria, or weakness/numbness/paresthesias in any extremity   Physical Exam  Triage Vital Signs: ED Triage Vitals  Encounter Vitals Group     BP 12/21/22 1740 129/85     Systolic BP Percentile --      Diastolic BP Percentile --      Pulse Rate 12/21/22 1740 88     Resp 12/21/22 1740 18     Temp 12/21/22 1740 98.4 F (36.9 C)     Temp Source 12/21/22 1740 Oral     SpO2 12/21/22 1740 100 %     Weight 12/21/22 1738 280 lb (127 kg)     Height 12/21/22 1738 5\' 10"  (1.778 m)     Head Circumference --      Peak Flow --      Pain Score 12/21/22 1738 6     Pain Loc --      Pain Education --      Exclude from Growth Chart --    Most recent vital signs: Vitals:   12/21/22 1740  BP: 129/85  Pulse: 88  Resp: 18  Temp: 98.4 F (36.9 C)  SpO2: 100%   General: Awake, oriented x4. CV:  Good peripheral perfusion.  Resp:  Normal effort.  Abd:  No distention.  Mild right upper quadrant abdominal tenderness to palpation Other:  Middle-aged obese African-American  male resting comfortably in no acute distress ED Results / Procedures / Treatments  Labs (all labs ordered are listed, but only abnormal results are displayed) Labs Reviewed  COMPREHENSIVE METABOLIC PANEL - Abnormal; Notable for the following components:      Result Value   CO2 21 (*)    Glucose, Bld 176 (*)    Creatinine, Ser 1.35 (*)    Total Protein 8.8 (*)    AST 46 (*)    ALT 45 (*)    Total Bilirubin 1.4 (*)    All other components within normal limits  CBC - Abnormal; Notable for the following components:   WBC 10.6 (*)    RDW 11.3 (*)    All other components within normal limits  LIPASE, BLOOD  URINALYSIS, ROUTINE W REFLEX MICROSCOPIC   RADIOLOGY ED MD interpretation: CT of the abdomen and pelvis pending -Agree with radiology assessment Official radiology report(s): No results found. PROCEDURES: Critical Care performed: No .1-3 Lead EKG Interpretation  Performed by: Merwyn Katos, MD Authorized by: Merwyn Katos, MD     Interpretation: normal     ECG rate:  81   ECG rate assessment: normal     Rhythm: sinus rhythm     Ectopy: none  Conduction: normal    MEDICATIONS ORDERED IN ED: Medications  ondansetron (ZOFRAN-ODT) disintegrating tablet 4 mg (4 mg Oral Given 12/21/22 1802)   IMPRESSION / MDM / ASSESSMENT AND PLAN / ED COURSE  I reviewed the triage vital signs and the nursing notes.                             The patient is on the cardiac monitor to evaluate for evidence of arrhythmia and/or significant heart rate changes. Patient's presentation is most consistent with acute presentation with potential threat to life or bodily function. Patient presents for abdominal pain.  Differential diagnosis includes appendicitis, abdominal aortic aneurysm, surgical biliary disease, pancreatitis, SBO, mesenteric ischemia, serious intra-abdominal bacterial illness, genital torsion. Doubt atypical ACS. Patient has mild leukocytosis, mild transaminitis, and mild  total bilirubin elevation.  Given the constellation of these findings and right sided abdominal pain, patient will require a CT of the abdomen/pelvis with IV contrast for further evaluation. Disposition: Care of this patient will be signed out to the oncoming physician at the end of my shift.  All pertinent patient information conveyed and all questions answered.  All further care and disposition decisions will be made by the oncoming physician.   FINAL CLINICAL IMPRESSION(S) / ED DIAGNOSES   Final diagnoses:  Right upper quadrant abdominal pain  Nausea  Diarrhea, unspecified type   Rx / DC Orders   ED Discharge Orders     None      Note:  This document was prepared using Dragon voice recognition software and may include unintentional dictation errors.   Merwyn Katos, MD 12/21/22 [phone]

## 2022-12-21 NOTE — ED Triage Notes (Signed)
Pt complains of abd pain, nausea and diarrhea started today. Denies chest pain or SOB. States feels like stomach is bloated and like he has gas.

## 2022-12-21 NOTE — ED Notes (Signed)
First Nurse Note: Pt here from [facility] from work, RUQ pain started this morning.   123/62 68 HR 94%  18G R AC.

## 2022-12-22 MED ORDER — ONDANSETRON 4 MG PO TBDP: ORAL_TABLET | ORAL | 0 refills | Status: DC

## 2022-12-22 NOTE — Discharge Instructions (Addendum)
We believe your symptoms are caused by either a viral infection or possibly a bad food exposure.  Either way, since your symptoms have improved, we feel it is safe for you to go home and follow up with your regular doctor.  Please read the included information and stick to a bland diet for the next two days.  Drink plenty of clear fluids, and if you were provided with a prescription, please take it according to the label instructions.    If you develop any new or worsening symptoms, including persistent vomiting not controlled with medication, fever greater than 101, severe or worsening abdominal pain, or other symptoms that concern you, please return immediately to the Emergency Department. 

## 2024-02-09 ENCOUNTER — Other Ambulatory Visit: Payer: Self-pay

## 2024-02-09 ENCOUNTER — Emergency Department
Admission: EM | Admit: 2024-02-09 | Discharge: 2024-02-09 | Disposition: A | Discharge status: Home / Self Care | Source: Home / Self Care

## 2024-02-09 DIAGNOSIS — R350 Frequency of micturition: Secondary | ICD-10-CM | POA: Diagnosis not present

## 2024-02-09 DIAGNOSIS — R739 Hyperglycemia, unspecified: Secondary | ICD-10-CM | POA: Insufficient documentation

## 2024-02-09 DIAGNOSIS — E860 Dehydration: Secondary | ICD-10-CM | POA: Insufficient documentation

## 2024-02-09 HISTORY — DX: Essential (primary) hypertension: I10

## 2024-02-09 LAB — URINALYSIS, ROUTINE W REFLEX MICROSCOPIC
Bacteria, UA: NONE SEEN
Bilirubin Urine: NEGATIVE
Glucose, UA: >=500 mg/dL — AB
Hgb urine dipstick: NEGATIVE
Ketones, ur: 5 mg/dL — AB
Leukocytes,Ua: NEGATIVE
Nitrite: NEGATIVE
Protein, ur: NEGATIVE mg/dL
Specific Gravity, Urine: 1.027 (ref 1.005–1.030)
pH: 5 (ref 5.0–8.0)

## 2024-02-09 LAB — CBC
HCT: 47.1 % (ref 39.0–52.0)
Hemoglobin: 15.1 g/dL (ref 13.0–17.0)
MCH: 28.5 pg (ref 26.0–34.0)
MCHC: 32.1 g/dL (ref 30.0–36.0)
MCV: 89 fL (ref 80.0–100.0)
Platelets: 325 K/uL (ref 150–400)
RBC: 5.29 MIL/uL (ref 4.22–5.81)
RDW: 11 % — ABNORMAL LOW (ref 11.5–15.5)
WBC: 6.3 K/uL (ref 4.0–10.5)
nRBC: 0 % (ref 0.0–0.2)

## 2024-02-09 LAB — BASIC METABOLIC PANEL WITH GFR
Anion gap: 12 (ref 5–15)
BUN: 11 mg/dL (ref 6–20)
CO2: 25 mmol/L (ref 22–32)
Calcium: 9.5 mg/dL (ref 8.9–10.3)
Chloride: 101 mmol/L (ref 98–111)
Creatinine, Ser: 1.16 mg/dL (ref 0.61–1.24)
GFR, Estimated: >60 mL/min
Glucose, Bld: 580 mg/dL (ref 70–99)
Potassium: 4.3 mmol/L (ref 3.5–5.1)
Sodium: 138 mmol/L (ref 135–145)

## 2024-02-09 LAB — CBG MONITORING, ED
Glucose-Capillary: 488 mg/dL — ABNORMAL HIGH (ref 70–99)
Glucose-Capillary: 339 mg/dL — ABNORMAL HIGH (ref 70–99)

## 2024-02-09 LAB — BLOOD GAS, VENOUS

## 2024-02-09 MED ORDER — METFORMIN HCL 500 MG PO TABS: 500.0000 mg | ORAL_TABLET | Freq: Two times a day (BID) | ORAL | 0 refills | Status: DC

## 2024-02-09 MED ORDER — INSULIN GLARGINE 100 UNIT/ML ~~LOC~~ SOLN
8.0000 [IU] | Freq: Once | SUBCUTANEOUS | Status: AC
  Administered 2024-02-09: 8 [IU] via SUBCUTANEOUS
  Filled 2024-02-09: qty 0.08

## 2024-02-09 MED ORDER — SODIUM CHLORIDE 0.9 % IV BOLUS
1000.0000 mL | Freq: Once | INTRAVENOUS | Status: AC
  Administered 2024-02-09: 1000 mL via INTRAVENOUS

## 2024-02-09 NOTE — Discharge Instructions (Addendum)
 You were seen today due to concern of elevated sugar levels.  You likely have diabetes, I have placed a referral for the primary doctor team, they should be contacting you to arrange for a follow-up appointment, but regardless you should still call their office to follow-up.  I have started you on a new medication, this medication can cause an upset stomach and some nausea, please be sure to follow-up and discuss continued use of this medication or changing the dosage of this medication with your primary doctor.  If you have any worsening of symptoms such as nausea, vomiting, abdominal pain, or any other symptoms you find concerning please return to the emergency department immediately for further medical management.  ?? Local Medication Assistance Programs  1. [facility]  Provides access to free generic medications with no copay and assistance applying for patient assistance programs for other needed prescriptions.  Phone: [phone] (call to see eligibility and application process).  Generally serves uninsured, low-income residents who struggle to afford medications.  [facility]  2. [place] [facility]ication [facility] ([place])  Local program helping uninsured low-income patients access medications through drug manufacturer assistance and community resources.  Phone: [phone] (same contact as [facility] in many cases -- confirm when calling).  Eligibility: Uninsured, income <= 250% of the federal poverty level (varies yearly).  [facility]  3. [facility] / [facility] Medication Programs  Health departments often help connect patients to [facility] prescription coverage options and may refer to medication assistance partners (call for details and referral).  Phone: [phone].  [facility]  4. [facility] Social Services - Medication Help Referrals  DSS can assist with applying  for Medicaid or prescription assistance programs and guide patients to free/low-cost resources.  Phone: [phone].  [facility] Social Services  ?? Statewide Assistance Options  5. [place] [facility] -- Free Pharmacy Program (Statewide)  Nonprofit that provides free prescription medications to [place]  residents who are uninsured, low-income, and meet eligibility guidelines.  Phone: [phone] (toll-free).  Patients can apply online or by phone for free medications mailed to their address.   6. [place]  Drug Card (Free Discount Program)  Free prescription assistance card available to all [place]  residents that helps reduce medication costs at many pharmacies.  Phone: [phone] (to request or get info).  Not insurance, but provides significant discounts on many prescriptions.  staterxplans.us   ?? Notes to Share With Patients  Many medication assistance programs require proof of income, lack of insurance coverage, and sometimes residency verification.  dietdisorder.fr  [place] [facility] may take 7-10 business days to enroll and ship medications.  https://[facility].org/  Health department and social services can help with Medicaid applications -- often key to long-term prescription coverage.  [facility]

## 2024-02-09 NOTE — ED Triage Notes (Signed)
 Pt comes with hyperglycemia. Pt is not diabetic. Pt has been urinating more often recently for last month. Pt bp also elevated n hx of HTN but didn't take meds today.

## 2024-02-09 NOTE — TOC Initial Note (Signed)
 Transition of Care [facility]) - Initial/Assessment Note    Patient Details  Name: Troy Garcia MRN: [id number] Date of Birth: 09/12/1975  Transition of Care [facility]) CM/SW Contact:    Abygayle Deltoro L Nivia Gervase, LCSW Phone Number: 02/09/2024, 1:34 PM  Clinical Narrative:                  [facility] consult received for Medication Assistance. Chart reviewed. Patient has insurance. TOC consult screened out.        Patient Goals and CMS Choice            Expected Discharge Plan and Services                                              Prior Living Arrangements/Services                       Activities of Daily Living      Permission Sought/Granted                  Emotional Assessment              Admission diagnosis:  bp ;bs elevated Patient Active Problem List   Diagnosis Date Noted   Cholecystitis, acute    PCP:  Patient, No Pcp Per Pharmacy:   [facility] [address] ([address]), [place] - [address] [place]) [place] [address] Phone: [phone] Fax: [phone]     Social Drivers of Health (SDOH) Social History: SDOH Screenings   Tobacco Use: Low Risk (02/09/2024)   SDOH Interventions:     Readmission Risk Interventions     No data to display

## 2024-02-09 NOTE — ED Provider Notes (Signed)
 "  [facility] Provider Note    Event Date/Time   First MD Initiated Contact with Patient 02/09/24 1102     (approximate)   History   Hyperglycemia   HPI  Troy Garcia is a 48 y.o. male presenting today with concern of hyperglycemia.  While at church she was feeling slightly lightheaded, one of the nurses at church checked his blood pressure and noted to be elevated, they told him to go to the emergency department for evaluation.  Lightheadedness is since resolved and he feels fine at this time, while in triage his glucose was checked and found to be significantly elevated.  Patient states that he has been feeling well he has not had any issues.  Grandmother has history of diabetes but no other family history of diabetes.  Patient states that he has been excessively thirsty and urinating a lot recently.  Physical Exam   Triage Vital Signs: ED Triage Vitals  Encounter Vitals Group     BP 02/09/24 1000 117/76     Girls Systolic BP Percentile --      Girls Diastolic BP Percentile --      Boys Systolic BP Percentile --      Boys Diastolic BP Percentile --      Pulse Rate 02/09/24 1000 93     Resp 02/09/24 1000 16     Temp 02/09/24 1000 98.1 F (36.7 C)     Temp Source 02/09/24 1000 Oral     SpO2 02/09/24 1000 96 %     Weight 02/09/24 1010 278 lb (126.1 kg)     Height 02/09/24 1010 5' 10 (1.778 m)     Head Circumference --      Peak Flow --      Pain Score 02/09/24 1010 0     Pain Loc --      Pain Education --      Exclude from Growth Chart --     Most recent vital signs: Vitals:   02/09/24 1000  BP: 117/76  Pulse: 93  Resp: 16  Temp: 98.1 F (36.7 C)  SpO2: 96%     General: Awake, no distress.  CV:  Good peripheral perfusion.  Resp:  Normal effort.  Abd:  No distention.  Other:     ED Results / Procedures / Treatments   Labs (all labs ordered are listed, but only abnormal results are displayed) Labs Reviewed  BASIC METABOLIC  PANEL WITH GFR - Abnormal; Notable for the following components:      Result Value   Glucose, Bld 580 (*)    All other components within normal limits  CBC - Abnormal; Notable for the following components:   RDW 11.0 (*)    All other components within normal limits  URINALYSIS, ROUTINE W REFLEX MICROSCOPIC - Abnormal; Notable for the following components:   Color, Urine STRAW (*)    APPearance CLEAR (*)    Glucose, UA >=500 (*)    Ketones, ur 5 (*)    All other components within normal limits  BLOOD GAS, VENOUS - Abnormal; Notable for the following components:   Bicarbonate 29.9 (*)    Acid-Base Excess 3.2 (*)    All other components within normal limits  CBG MONITORING, ED - Abnormal; Notable for the following components:   Glucose-Capillary 488 (*)    All other components within normal limits  CBG MONITORING, ED - Abnormal; Notable for the following components:   Glucose-Capillary 339 (*)  All other components within normal limits  CBG MONITORING, ED  CBG MONITORING, ED     EKG     RADIOLOGY   PROCEDURES:  Critical Care performed: No  Procedures   MEDICATIONS ORDERED IN ED: Medications  sodium chloride  0.9 % bolus 1,000 mL (0 mLs Intravenous Stopped 02/09/24 1357)  insulin  glargine (LANTUS ) injection 8 Units (8 Units Subcutaneous Given 02/09/24 1241)     IMPRESSION / MDM / ASSESSMENT AND PLAN / ED COURSE  I reviewed the triage vital signs and the nursing notes.                               Patient's presentation is most consistent with acute complicated illness / injury requiring diagnostic workup.  48 year old male presenting today with concern of hyperglycemia.  He appears well has not any acute distress, blood sugars are significantly elevated.  I reviewed his records from prior visits and it seems that his glucose was elevated in the 170s about a year ago.  Given the findings we will give a dose of insulin  as well as 1 L of fluids and continue to  monitor glucose, if reasonable and they have been discharged home with metformin  and outpatient follow-up we are obtaining a VBG if it seems to have significant acidosis that may warrant admission.   Clinical Course as of 02/09/24 1454  Sun Feb 09, 2024  1430 Repeat glucose here of 339.  Believe reasonable to have the patient discharged home, instructed to follow-up with primary doctor, provided contact information as well and placed a referral for primary care.  I will start the patient on a course of metformin . [SK]    Clinical Course User Index [SK] Fernand Rossie HERO, MD     FINAL CLINICAL IMPRESSION(S) / ED DIAGNOSES   Final diagnoses:  Dehydration  Hyperglycemia     Rx / DC Orders   ED Discharge Orders          Ordered    Ambulatory Referral to Primary Care (Establish Care)        02/09/24 1454    metFORMIN  (GLUCOPHAGE ) 500 MG tablet  2 times daily with meals        02/09/24 1454             Note:  This document was prepared using Dragon voice recognition software and may include unintentional dictation errors.   Fernand Rossie HERO, MD 02/09/24 1454  "

## 2024-02-09 NOTE — ED Notes (Signed)
 Dr. Fernand aware of BP, pt expresses no neurological deficits or concerns, has not taken meds today.  Pt encouraged to take meds once he returns home and to follow up with PCP concerning elevated BP.

## 2024-02-10 LAB — BLOOD GAS, VENOUS
Bicarbonate: 29.9 mmol/L — ABNORMAL HIGH (ref 20.0–28.0)
O2 Saturation: 45.5 % — AB (ref 0.0–2.0)
Patient temperature: 37
Patient temperature: 45.5 %
pCO2, Ven: 53 mmHg (ref 44–60)
pH, Ven: 7.36 (ref 7.25–7.43)
pO2, Ven: 29.9 mmHg — AB (ref 32–45)

## 2024-02-28 ENCOUNTER — Encounter: Payer: Self-pay | Admitting: Internal Medicine

## 2024-02-28 ENCOUNTER — Ambulatory Visit: Admitting: Internal Medicine

## 2024-02-28 VITALS — BP 136/82 | HR 77 | Ht 70.0 in | Wt 280.4 lb

## 2024-02-28 DIAGNOSIS — E1159 Type 2 diabetes mellitus with other circulatory complications: Secondary | ICD-10-CM | POA: Diagnosis not present

## 2024-02-28 DIAGNOSIS — E1165 Type 2 diabetes mellitus with hyperglycemia: Secondary | ICD-10-CM | POA: Diagnosis not present

## 2024-02-28 DIAGNOSIS — Z7689 Persons encountering health services in other specified circumstances: Secondary | ICD-10-CM

## 2024-02-28 DIAGNOSIS — I152 Hypertension secondary to endocrine disorders: Secondary | ICD-10-CM | POA: Diagnosis not present

## 2024-02-28 DIAGNOSIS — R748 Abnormal levels of other serum enzymes: Secondary | ICD-10-CM | POA: Insufficient documentation

## 2024-02-28 DIAGNOSIS — E119 Type 2 diabetes mellitus without complications: Secondary | ICD-10-CM | POA: Insufficient documentation

## 2024-02-28 DIAGNOSIS — Z6841 Body Mass Index (BMI) 40.0 and over, adult: Secondary | ICD-10-CM

## 2024-02-28 DIAGNOSIS — Z1211 Encounter for screening for malignant neoplasm of colon: Secondary | ICD-10-CM | POA: Insufficient documentation

## 2024-02-28 LAB — POCT CBG (FASTING - GLUCOSE)-MANUAL ENTRY: Glucose Fasting, POC: 115 mg/dL — AB (ref 70–99)

## 2024-02-28 LAB — POC CREATINE & ALBUMIN,URINE
Albumin/Creatinine Ratio, Urine, POC: <30
Creatinine, POC: 300 mg/dL
Microalbumin Ur, POC: 30 mg/L

## 2024-02-28 LAB — POCT URINALYSIS DIPSTICK
Bilirubin, UA: POSITIVE
Blood, UA: POSITIVE
Glucose, UA: NEGATIVE
Ketones, UA: POSITIVE
Leukocytes, UA: NEGATIVE
Nitrite, UA: NEGATIVE
Protein, UA: NEGATIVE
Spec Grav, UA: 1.02
Urobilinogen, UA: 4 U/dL — AB
pH, UA: 5.5

## 2024-02-28 MED ORDER — BLOOD GLUCOSE MONITORING SUPPL W/DEVICE KIT: 1.0000 | PACK | Freq: Two times a day (BID) | 0 refills | Status: AC

## 2024-02-28 MED ORDER — GLUCAGON EMERGENCY 1 MG IJ SOLR: 1.0000 mg | INTRAMUSCULAR | 1 refills | Status: AC

## 2024-02-28 MED ORDER — INSULIN GLARGINE 100 UNIT/ML SOLOSTAR PEN: 15.0000 [IU] | PEN_INJECTOR | Freq: Every day | SUBCUTANEOUS | 11 refills | Status: DC

## 2024-02-28 MED ORDER — INSULIN GLARGINE 100 UNIT/ML SOLOSTAR PEN: 15.0000 [IU] | PEN_INJECTOR | Freq: Every day | SUBCUTANEOUS | 11 refills | Status: AC

## 2024-02-28 MED ORDER — METFORMIN HCL ER 500 MG PO TB24: 500.0000 mg | ORAL_TABLET | Freq: Two times a day (BID) | ORAL | 3 refills | Status: DC

## 2024-02-28 MED ORDER — INSULIN PEN NEEDLE 29G X 4MM MISC: 1.0000 | Freq: Two times a day (BID) | 3 refills | Status: AC

## 2024-02-28 NOTE — Progress Notes (Signed)
 "  New Patient Office Visit  Subjective   Patient ID: Troy Garcia, male    DOB: 1975-05-23  Age: 49 y.o. MRN: [id number]  CC:  Chief Complaint  Patient presents with   Establish Care    NPE    HPI Troy Garcia presents to establish care Previous Primary Care provider/office:   he does have additional concerns to discuss today.   Patient is here today to establish care with office as PCP. He reports feeling overall well today.  He recently was seen ED on 02/09/24 for dehydration. He was hyperglycemic and give 1 dose Lantus  insulin  and 1L IVFs. He was discharged home on Metformin .  He has HTN and it seems he is well controlled on current regimen of Amlodipine and Losartan. He reports taking his medications as prescribed without side effects. Will d/c Metformin  due to ketones and elevated liver enzymes. Start Lantus  15 units once nightly and continue checking fasting blood sugars at least once daily and as needed.  FH: Father had HTN, blood sugar problems unsure if truly diabetic; MGM had diabetes. PGF unsure but thinks he had colon cancer. He reports getting annual eye exams due to astigmatism. He wears glasses as needed.  He got covid and flu vaccine this season. He is due for routine blood work today and patient has not eaten since 0730 this morning. Will get labs collected today.     Outpatient Encounter Medications as of 02/28/2024  Medication Sig   amLODipine (NORVASC) 5 MG tablet Take 5 mg by mouth daily.   Blood Glucose Monitoring Suppl w/Device KIT 1 each by Does not apply route 2 (two) times daily at 8 am and 10 pm.   glucagon  Emergency 1 MG SOLR Inject 1 mg into the skin See admin instructions. Follow package directions for low blood sugar.   Insulin  Pen Needle 29G X MISC 1 Box by Does not apply route 2 (two) times daily.   losartan (COZAAR) 25 MG tablet Take 25 mg by mouth daily.   [DISCONTINUED] insulin  glargine (LANTUS ) 100 UNIT/ML Solostar Pen Inject  15 Units into the skin daily.   [DISCONTINUED] metFORMIN  (GLUCOPHAGE ) 500 MG tablet Take 1 tablet (500 mg total) by mouth 2 (two) times daily with a meal.   [DISCONTINUED] metFORMIN  (GLUCOPHAGE -XR) 500 MG 24 hr tablet Take 1 tablet (500 mg total) by mouth 2 (two) times daily with a meal.   insulin  glargine (LANTUS ) 100 UNIT/ML Solostar Pen Inject 15 Units into the skin daily.   [DISCONTINUED] acetaminophen  (TYLENOL ) 500 MG tablet Take 500 mg by mouth every 6 (six) hours as needed. (Patient not taking: Reported on 02/28/2024)   [DISCONTINUED] amoxicillin -clavulanate (AUGMENTIN ) 875-125 MG tablet Take 1 tablet by mouth 2 (two) times daily. (Patient not taking: Reported on 02/28/2024)   [DISCONTINUED] ondansetron  (ZOFRAN -ODT) 4 MG disintegrating tablet Allow 1-2 tablets to dissolve in your mouth every 8 hours as needed for nausea/vomiting (Patient not taking: Reported on 02/28/2024)   No facility-administered encounter medications on file as of 02/28/2024.    Past Medical History:  Diagnosis Date   Hypertension     Past Surgical History:  Procedure Laterality Date   CHOLECYSTECTOMY N/A 06/13/2016   Procedure: LAPAROSCOPIC CHOLECYSTECTOMY;  Surgeon: Laneta JULIANNA Luna, MD;  Location: [facility] ORS;  Service: General;  Laterality: N/A;  hand assisted    Family History  Problem Relation Age of Onset   Diabetes Maternal Grandmother     Social History   Socioeconomic History   Marital  status: Married    Spouse name: Not on file   Number of children: Not on file   Years of education: Not on file   Highest education level: Not on file  Occupational History   Not on file  Tobacco Use   Smoking status: Never   Smokeless tobacco: Never  Vaping Use   Vaping status: Never Used  Substance and Sexual Activity   Alcohol use: No   Drug use: No   Sexual activity: Not on file  Other Topics Concern   Not on file  Social History Narrative   Not on file   Social Drivers of Health   Tobacco Use: Low Risk  (02/28/2024)   Patient History    Smoking Tobacco Use: Never    Smokeless Tobacco Use: Never    Passive Exposure: Not on file  Financial Resource Strain: Not on file  Food Insecurity: Not on file  Transportation Needs: Not on file  Physical Activity: Not on file  Stress: Not on file  Social Connections: Not on file  Intimate Partner Violence: Not on file  Depression (PHQ2-9): Low Risk (02/28/2024)   Depression (PHQ2-9)    PHQ-2 Score: 1  Alcohol Screen: Not on file  Housing: Not on file  Utilities: Not on file  Health Literacy: Not on file    Review of Systems  Constitutional: Negative.  Negative for chills, fever and malaise/fatigue.  HENT: Negative.  Negative for congestion and sore throat.   Eyes: Negative.  Negative for blurred vision and pain.  Respiratory: Negative.  Negative for cough and shortness of breath.   Cardiovascular: Negative.  Negative for chest pain, palpitations and leg swelling.  Gastrointestinal: Negative.  Negative for abdominal pain, blood in stool, constipation, diarrhea, heartburn, melena, nausea and vomiting.  Genitourinary: Negative.  Negative for dysuria, flank pain, frequency and urgency.  Musculoskeletal: Negative.  Negative for joint pain and myalgias.  Skin: Negative.   Neurological: Negative.  Negative for dizziness, tingling, sensory change, weakness and headaches.  Endo/Heme/Allergies: Negative.   Psychiatric/Behavioral: Negative.  Negative for depression and suicidal ideas. The patient is not nervous/anxious.         Objective   BP 136/82   Pulse 77   Ht 5' 10 (1.778 m)   Wt 280 lb 6.4 oz (127.2 kg)   SpO2 99%   BMI 40.23 kg/m   Physical Exam Vitals and nursing note reviewed.  Constitutional:      General: He is not in acute distress.    Appearance: Normal appearance. He is not ill-appearing.  HENT:     Head: Normocephalic and atraumatic.     Nose: Nose normal.     Mouth/Throat:     Mouth: Mucous membranes are moist.      Pharynx: Oropharynx is clear.  Eyes:     Conjunctiva/sclera: Conjunctivae normal.     Pupils: Pupils are equal, round, and reactive to light.  Cardiovascular:     Rate and Rhythm: Normal rate and regular rhythm.     Pulses: Normal pulses.     Heart sounds: Normal heart sounds.  Pulmonary:     Effort: Pulmonary effort is normal.     Breath sounds: Normal breath sounds. No wheezing or rhonchi.  Abdominal:     General: Bowel sounds are normal. There is no distension.     Palpations: Abdomen is soft.     Tenderness: There is no abdominal tenderness.  Musculoskeletal:        General: Normal range of motion.  Cervical back: Normal range of motion and neck supple.     Right lower leg: No edema.     Left lower leg: No edema.  Skin:    General: Skin is warm and dry.     Capillary Refill: Capillary refill takes less than 2 seconds.  Neurological:     General: No focal deficit present.     Mental Status: He is alert and oriented to person, place, and time.     Sensory: No sensory deficit.     Motor: No weakness.  Psychiatric:        Mood and Affect: Mood normal.        Behavior: Behavior normal.        Judgment: Judgment normal.        Assessment & Plan:  Start Lantus  15 units at bedtime. Continue other medications as prescribed. Check routine blood work today and FU with results. Continue checking blood glucose at least once a day and as needed. Fasting blood glucose range 100-120 as discussed with patient. Check UA and UAC. GI referral for colonoscopy. Problem List Items Addressed This Visit       Cardiovascular and Mediastinum   Hypertension associated with diabetes (HCC) - Primary   Relevant Medications   amLODipine (NORVASC) 5 MG tablet   losartan (COZAAR) 25 MG tablet   glucagon  Emergency 1 MG SOLR   insulin  glargine (LANTUS ) 100 UNIT/ML Solostar Pen     Endocrine   Type 2 diabetes mellitus without complication, without long-term current use of insulin  (HCC)    Relevant Medications   losartan (COZAAR) 25 MG tablet   Blood Glucose Monitoring Suppl w/Device KIT   glucagon  Emergency 1 MG SOLR   insulin  glargine (LANTUS ) 100 UNIT/ML Solostar Pen   Insulin  Pen Needle 29G X MISC   Other Relevant Orders   POCT CBG (Fasting - Glucose) (Completed)   POCT Urinalysis Dipstick (81002) (Completed)   POC CREATINE & ALBUMIN,URINE (Completed)   Hemoglobin A1c     Other   Encounter to establish care with new doctor   Relevant Orders   POCT Urinalysis Dipstick (81002) (Completed)   Lipid panel   VITAMIN D  25 Hydroxy (Vit-D Deficiency, Fractures)   CMP14+EGFR   TSH   Hemoglobin A1c   Vitamin B12   CBC with Diff   Colon cancer screening   Relevant Orders   Ambulatory referral to Gastroenterology   Elevated liver enzymes   Relevant Orders   Lipase   Morbid obesity with body mass index (BMI) of 40.0 to 44.9 in adult [facility])   Relevant Medications   glucagon  Emergency 1 MG SOLR   insulin  glargine (LANTUS ) 100 UNIT/ML Solostar Pen    Return in about 10 days (around 03/09/2024).   Total time spent: 45 minutes. This time includes review of previous notes and results and patient face to face interaction during today's visit.    FERNAND FREDY RAMAN, MD  02/28/2024   This document may have been prepared by [facility] Voice Recognition software and as such may include unintentional dictation errors.  "

## 2024-02-29 LAB — CMP14+EGFR
ALT: 47 IU/L — ABNORMAL HIGH (ref 0–44)
AST: 43 IU/L — ABNORMAL HIGH (ref 0–40)
Albumin: 4.3 g/dL (ref 4.1–5.1)
Alkaline Phosphatase: 81 IU/L (ref 47–123)
BUN/Creatinine Ratio: 6 — ABNORMAL LOW (ref 9–20)
BUN: 7 mg/dL (ref 6–24)
Bilirubin Total: 0.9 mg/dL (ref 0.0–1.2)
CO2: 23 mmol/L (ref 20–29)
Calcium: 9.6 mg/dL (ref 8.7–10.2)
Chloride: 103 mmol/L (ref 96–106)
Creatinine, Ser: 1.08 mg/dL (ref 0.76–1.27)
Globulin, Total: 3 g/dL (ref 1.5–4.5)
Glucose: 107 mg/dL — ABNORMAL HIGH (ref 70–99)
Potassium: 4 mmol/L (ref 3.5–5.2)
Sodium: 139 mmol/L (ref 134–144)
Total Protein: 7.3 g/dL (ref 6.0–8.5)
eGFR: 84 mL/min/1.73

## 2024-02-29 LAB — CBC WITH DIFFERENTIAL/PLATELET
Basophils Absolute: 0 x10E3/uL (ref 0.0–0.2)
Basos: 0 %
EOS (ABSOLUTE): 0 x10E3/uL (ref 0.0–0.4)
Eos: 1 %
Hematocrit: 37.2 % — ABNORMAL LOW (ref 37.5–51.0)
Hemoglobin: 11.9 g/dL — ABNORMAL LOW (ref 13.0–17.7)
Immature Grans (Abs): 0 x10E3/uL (ref 0.0–0.1)
Immature Granulocytes: 0 %
Lymphocytes Absolute: 3.1 x10E3/uL (ref 0.7–3.1)
Lymphs: 44 %
MCH: 29.6 pg (ref 26.6–33.0)
MCHC: 32 g/dL (ref 31.5–35.7)
MCV: 93 fL (ref 79–97)
Monocytes Absolute: 0.6 x10E3/uL (ref 0.1–0.9)
Monocytes: 8 %
Neutrophils Absolute: 3.3 x10E3/uL (ref 1.4–7.0)
Neutrophils: 47 %
Platelets: 418 x10E3/uL (ref 150–450)
RBC: 4.02 x10E6/uL — ABNORMAL LOW (ref 4.14–5.80)
RDW: 11.9 % (ref 11.6–15.4)
WBC: 7.1 x10E3/uL (ref 3.4–10.8)

## 2024-02-29 LAB — LIPASE: Lipase: 30 U/L (ref 13–78)

## 2024-02-29 LAB — LIPID PANEL
Chol/HDL Ratio: 7.5 ratio — ABNORMAL HIGH (ref 0.0–5.0)
Cholesterol, Total: 234 mg/dL — ABNORMAL HIGH (ref 100–199)
HDL: 31 mg/dL — ABNORMAL LOW
LDL Chol Calc (NIH): 176 mg/dL — ABNORMAL HIGH (ref 0–99)
Triglycerides: 144 mg/dL (ref 0–149)
VLDL Cholesterol Cal: 27 mg/dL (ref 5–40)

## 2024-02-29 LAB — HEMOGLOBIN A1C
Est. average glucose Bld gHb Est-mCnc: 206 mg/dL
Hgb A1c MFr Bld: 8.8 % — ABNORMAL HIGH (ref 4.8–5.6)

## 2024-02-29 LAB — VITAMIN B12: Vitamin B-12: 509 pg/mL (ref 232–1245)

## 2024-02-29 LAB — VITAMIN D 25 HYDROXY (VIT D DEFICIENCY, FRACTURES): Vit D, 25-Hydroxy: 5.9 ng/mL — ABNORMAL LOW (ref 30.0–100.0)

## 2024-02-29 LAB — TSH: TSH: 1.4 u[IU]/mL (ref 0.450–4.500)

## 2024-03-02 ENCOUNTER — Ambulatory Visit: Payer: Self-pay | Admitting: Internal Medicine

## 2024-03-02 DIAGNOSIS — E1169 Type 2 diabetes mellitus with other specified complication: Secondary | ICD-10-CM

## 2024-03-02 DIAGNOSIS — E559 Vitamin D deficiency, unspecified: Secondary | ICD-10-CM

## 2024-03-02 MED ORDER — VITAMIN D3 1.25 MG (50000 UT) PO CAPS: 1.0000 | ORAL_CAPSULE | ORAL | 3 refills | Status: AC

## 2024-03-02 MED ORDER — ROSUVASTATIN CALCIUM 10 MG PO TABS: 10.0000 mg | ORAL_TABLET | Freq: Every day | ORAL | 3 refills | Status: AC

## 2024-03-09 ENCOUNTER — Ambulatory Visit: Admitting: Internal Medicine

## 2024-03-09 ENCOUNTER — Encounter: Payer: Self-pay | Admitting: Internal Medicine

## 2024-03-09 VITALS — BP 110/80 | HR 82 | Ht 70.0 in | Wt 276.0 lb

## 2024-03-09 DIAGNOSIS — D649 Anemia, unspecified: Secondary | ICD-10-CM | POA: Insufficient documentation

## 2024-03-09 DIAGNOSIS — Z6839 Body mass index (BMI) 39.0-39.9, adult: Secondary | ICD-10-CM | POA: Diagnosis not present

## 2024-03-09 DIAGNOSIS — E782 Mixed hyperlipidemia: Secondary | ICD-10-CM | POA: Diagnosis not present

## 2024-03-09 DIAGNOSIS — E1169 Type 2 diabetes mellitus with other specified complication: Secondary | ICD-10-CM | POA: Diagnosis not present

## 2024-03-09 DIAGNOSIS — I152 Hypertension secondary to endocrine disorders: Secondary | ICD-10-CM | POA: Diagnosis not present

## 2024-03-09 DIAGNOSIS — E119 Type 2 diabetes mellitus without complications: Secondary | ICD-10-CM

## 2024-03-09 DIAGNOSIS — E1165 Type 2 diabetes mellitus with hyperglycemia: Secondary | ICD-10-CM

## 2024-03-09 DIAGNOSIS — E1159 Type 2 diabetes mellitus with other circulatory complications: Secondary | ICD-10-CM

## 2024-03-09 DIAGNOSIS — E66812 Obesity, class 2: Secondary | ICD-10-CM

## 2024-03-09 LAB — POCT CBG (FASTING - GLUCOSE)-MANUAL ENTRY: Glucose Fasting, POC: 115 mg/dL — AB (ref 70–99)

## 2024-03-09 MED ORDER — LOSARTAN POTASSIUM 25 MG PO TABS: 25.0000 mg | ORAL_TABLET | Freq: Every day | ORAL | 3 refills | Status: AC

## 2024-03-09 MED ORDER — AMLODIPINE BESYLATE 5 MG PO TABS: 5.0000 mg | ORAL_TABLET | Freq: Every day | ORAL | 3 refills | Status: AC

## 2024-03-09 NOTE — Progress Notes (Signed)
 "  Established Patient Office Visit  Subjective:  Patient ID: Troy Garcia, male    DOB: 11-16-1975  Age: 49 y.o. MRN: [id number]  Chief Complaint  Patient presents with   Follow-up    10 day follow up    Patient is here today for follow up. He reports feeling well today. He has been taking his medications as prescribed. Last HbgA1c was 8.8. patient is newly diagnosed diabetic but has a family history of diabetes. He reports on average glucose range 110-120s. He is continuing to work on [employer]. Reinforced weight loss can improve overall health and improve hypertension, hyperlipidemia, and diabetes. Patient reports he has been cutting back on his alcohol intake as well.  He reports not having bloody stools, blood in his urine, nor reflux symptoms. Will recheck CBC and provide patient with stool cards to complete as his recent CBC indicated anemia. He has GI referral pending for colon cancer screening.  He needs refills on his BP medication. Will send those to his pharmacy.    No other concerns at this time.   Past Medical History:  Diagnosis Date   Hypertension     Past Surgical History:  Procedure Laterality Date   CHOLECYSTECTOMY N/A 06/13/2016   Procedure: LAPAROSCOPIC CHOLECYSTECTOMY;  Surgeon: Laneta JULIANNA Luna, MD;  Location: [facility] ORS;  Service: General;  Laterality: N/A;  hand assisted    Social History   Socioeconomic History   Marital status: Married    Spouse name: Not on file   Number of children: Not on file   Years of education: Not on file   Highest education level: Not on file  Occupational History   Not on file  Tobacco Use   Smoking status: Never   Smokeless tobacco: Never  Vaping Use   Vaping status: Never Used  Substance and Sexual Activity   Alcohol use: No   Drug use: No   Sexual activity: Not on file  Other Topics Concern   Not on file  Social History Narrative   Not on file   Social Drivers of Health   Tobacco Use: Low Risk  (03/09/2024)   Patient History    Smoking Tobacco Use: Never    Smokeless Tobacco Use: Never    Passive Exposure: Not on file  Financial Resource Strain: Not on file  Food Insecurity: Not on file  Transportation Needs: Not on file  Physical Activity: Not on file  Stress: Not on file  Social Connections: Not on file  Intimate Partner Violence: Not on file  Depression (PHQ2-9): Low Risk (02/28/2024)   Depression (PHQ2-9)    PHQ-2 Score: 1  Alcohol Screen: Not on file  Housing: Not on file  Utilities: Not on file  Health Literacy: Not on file    Family History  Problem Relation Age of Onset   Diabetes Maternal Grandmother     Allergies[1]  Show/hide medication list[2]  Review of Systems  Constitutional: Negative.  Negative for chills, fever and malaise/fatigue.  HENT: Negative.  Negative for congestion and sore throat.   Eyes: Negative.  Negative for blurred vision and pain.  Respiratory: Negative.  Negative for cough and shortness of breath.   Cardiovascular: Negative.  Negative for chest pain, palpitations and leg swelling.  Gastrointestinal: Negative.  Negative for abdominal pain, blood in stool, constipation, diarrhea, heartburn, melena, nausea and vomiting.  Genitourinary: Negative.  Negative for dysuria, flank pain, frequency and urgency.  Musculoskeletal: Negative.  Negative for joint pain and myalgias.  Skin:  Negative.   Neurological: Negative.  Negative for dizziness, tingling, sensory change, weakness and headaches.  Endo/Heme/Allergies: Negative.   Psychiatric/Behavioral: Negative.  Negative for depression and suicidal ideas. The patient is not nervous/anxious.        Objective:   BP 110/80   Pulse 82   Ht 5' 10 (1.778 m)   Wt 276 lb (125.2 kg)   SpO2 97%   BMI 39.60 kg/m   Vitals:   03/09/24 1428  BP: 110/80  Pulse: 82  Height: 5' 10 (1.778 m)  Weight: 276 lb (125.2 kg)  SpO2: 97%  BMI (Calculated): 39.6    Physical Exam Vitals and nursing  note reviewed.  Constitutional:      General: He is not in acute distress.    Appearance: Normal appearance. He is not ill-appearing.  HENT:     Head: Normocephalic and atraumatic.     Nose: Nose normal.     Mouth/Throat:     Mouth: Mucous membranes are moist.     Pharynx: Oropharynx is clear.  Eyes:     Conjunctiva/sclera: Conjunctivae normal.     Pupils: Pupils are equal, round, and reactive to light.  Cardiovascular:     Rate and Rhythm: Normal rate and regular rhythm.     Pulses: Normal pulses.     Heart sounds: Normal heart sounds.  Pulmonary:     Effort: Pulmonary effort is normal.     Breath sounds: Normal breath sounds. No wheezing or rhonchi.  Abdominal:     General: Bowel sounds are normal. There is no distension.     Palpations: Abdomen is soft.     Tenderness: There is no abdominal tenderness.  Musculoskeletal:        General: Normal range of motion.     Cervical back: Normal range of motion and neck supple.     Right lower leg: No edema.     Left lower leg: No edema.  Skin:    General: Skin is warm and dry.     Capillary Refill: Capillary refill takes less than 2 seconds.  Neurological:     General: No focal deficit present.     Mental Status: He is alert and oriented to person, place, and time.     Sensory: No sensory deficit.     Motor: No weakness.  Psychiatric:        Mood and Affect: Mood normal.        Behavior: Behavior normal.        Judgment: Judgment normal.      Results for orders placed or performed in visit on 03/09/24  POCT CBG (Fasting - Glucose)  Result Value Ref Range   Glucose Fasting, POC 115 (A) 70 - 99 mg/dL    Recent Results (from the past 2160 hours)  CBG monitoring, ED     Status: Abnormal   Collection Time: 02/09/24  9:59 AM  Result Value Ref Range   Glucose-Capillary 488 (H) 70 - 99 mg/dL    Comment: Glucose reference range applies only to samples taken after fasting for at least 8 hours.  Basic metabolic panel      Status: Abnormal   Collection Time: 02/09/24 10:08 AM  Result Value Ref Range   Sodium 138 135 - 145 mmol/L   Potassium 4.3 3.5 - 5.1 mmol/L   Chloride 101 98 - 111 mmol/L   CO2 25 22 - 32 mmol/L   Glucose, Bld 580 (HH) 70 - 99 mg/dL    Comment:  Critical Value, Read Back and verified with SAMANTHA HAMILTON @1048  02/09/24 MJU Glucose reference range applies only to samples taken after fasting for at least 8 hours.    BUN 11 6 - 20 mg/dL   Creatinine, Ser 8.83 0.61 - 1.24 mg/dL   Calcium  9.5 8.9 - 10.3 mg/dL   GFR, Estimated >39 >39 mL/min    Comment: (NOTE) Calculated using the CKD-EPI Creatinine Equation (2021)    Anion gap 12 5 - 15    Comment: Performed at [facility], [address] [place], [place] [address]  CBC     Status: Abnormal   Collection Time: 02/09/24 10:08 AM  Result Value Ref Range   WBC 6.3 4.0 - 10.5 K/uL   RBC 5.29 4.22 - 5.81 MIL/uL   Hemoglobin 15.1 13.0 - 17.0 g/dL   HCT 52.8 60.9 - 47.9 %   MCV 89.0 80.0 - 100.0 fL   MCH 28.5 26.0 - 34.0 pg   MCHC 32.1 30.0 - 36.0 g/dL   RDW 88.9 (L) 88.4 - 84.4 %   Platelets 325 150 - 400 K/uL   nRBC 0.0 0.0 - 0.2 %    Comment: Performed at [facility], [address] [place], [place] [address]  Urinalysis, Routine w reflex microscopic -Urine, Random     Status: Abnormal   Collection Time: 02/09/24 10:08 AM  Result Value Ref Range   Color, Urine STRAW (A) YELLOW   APPearance CLEAR (A) CLEAR   Specific Gravity, Urine 1.027 1.005 - 1.030   pH 5.0 5.0 - 8.0   Glucose, UA >=500 (A) NEGATIVE mg/dL   Hgb urine dipstick NEGATIVE NEGATIVE   Bilirubin Urine NEGATIVE NEGATIVE   Ketones, ur 5 (A) NEGATIVE mg/dL   Protein, ur NEGATIVE NEGATIVE mg/dL   Nitrite NEGATIVE NEGATIVE   Leukocytes,Ua NEGATIVE NEGATIVE   RBC / HPF 0-5 0 - 5 RBC/hpf   WBC, UA 0-5 0 - 5 WBC/hpf   Bacteria, UA NONE SEEN NONE SEEN   Squamous Epithelial / HPF 0-5 0 - 5 /HPF    Comment: Performed at [facility], [address] [place], [place] [address]  Blood gas, venous     Status: Abnormal   Collection Time: 02/09/24 11:48 AM  Result Value Ref Range   pH, Ven 7.36 7.25 - 7.43   pCO2, Ven 53 44 - 60 mmHg   Bicarbonate 29.9 (H) 20.0 - 28.0 mmol/L   Acid-Base Excess 3.2 (H) 0.0 - 2.0 mmol/L   O2 Saturation 45.5 %   Patient temperature 37.0    Collection site VENOUS     Comment: Performed at [facility], [address] [place], [place] [address]  CBG monitoring, ED     Status: Abnormal   Collection Time: 02/09/24  2:13 PM  Result Value Ref Range   Glucose-Capillary 339 (H) 70 - 99 mg/dL    Comment: Glucose reference range applies only to samples taken after fasting for at least 8 hours.  POCT CBG (Fasting - Glucose)     Status: Abnormal   Collection Time: 02/28/24  2:08 PM  Result Value Ref Range   Glucose Fasting, POC 115 (A) 70 - 99 mg/dL  POCT Urinalysis Dipstick (18997)     Status: Abnormal   Collection Time: 02/28/24  2:23 PM  Result Value Ref Range   Color, UA Yellow    Clarity, UA Clear    Glucose, UA Negative Negative   Bilirubin, UA Positive    Ketones,  UA Positive    Spec Grav, UA 1.020 1.010 - 1.025   Blood, UA Positive    pH, UA 5.5 5.0 - 8.0   Protein, UA Negative Negative   Urobilinogen, UA 4.0 (A) 0.2 or 1.0 E.U./dL   Nitrite, UA Negative    Leukocytes, UA Negative Negative   Appearance     Odor    POC CREATINE & ALBUMIN,URINE     Status: Normal   Collection Time: 02/28/24  2:25 PM  Result Value Ref Range   Microalbumin Ur, POC 30 mg/L   Creatinine, POC 300 mg/dL   Albumin/Creatinine Ratio, Urine, POC <30   Lipid panel     Status: Abnormal   Collection Time: 02/28/24  3:26 PM  Result Value Ref Range   Cholesterol, Total 234 (H) 100 - 199 mg/dL   Triglycerides 855 0 - 149 mg/dL   HDL 31 (L) >60 mg/dL   VLDL Cholesterol Cal 27 5 - 40 mg/dL   LDL Chol Calc (NIH) 823 (H) 0 - 99 mg/dL   Chol/HDL Ratio 7.5 (H) 0.0 - 5.0 ratio    Comment:                                    T. Chol/HDL Ratio                                             Men  Women                               1/2 Avg.Risk  3.4    3.3                                   Avg.Risk  5.0    4.4                                2X Avg.Risk  9.6    7.1                                3X Avg.Risk 23.4   11.0   VITAMIN D  25 Hydroxy (Vit-D Deficiency, Fractures)     Status: Abnormal   Collection Time: 02/28/24  3:26 PM  Result Value Ref Range   Vit D, 25-Hydroxy 5.9 (L) 30.0 - 100.0 ng/mL    Comment: Vitamin D  deficiency has been defined by the Institute of Medicine and an Endocrine Society practice guideline as a level of serum 25-OH vitamin D  less than 20 ng/mL (1,2). The Endocrine Society went on to further define vitamin D  insufficiency as a level between 21 and 29 ng/mL (2). 1. IOM (Institute of Medicine). 2010. Dietary reference    intakes for calcium  and D. [place]: The    [employer]. 2. Holick MF, Binkley [place], Bischoff-Ferrari HA, et al.    Evaluation, treatment, and prevention of vitamin D     deficiency: an Endocrine Society clinical practice    guideline. JCEM. 2011 Jul; 96(7):1911-30.   CMP14+EGFR     Status: Abnormal   Collection Time: 02/28/24  3:26 PM  Result Value Ref Range   Glucose 107 (H) 70 - 99 mg/dL   BUN 7 6 - 24 mg/dL   Creatinine, Ser 8.91 0.76 - 1.27 mg/dL   eGFR 84 >40 fO/fpw/8.26   BUN/Creatinine Ratio 6 (L) 9 - 20   Sodium 139 134 - 144 mmol/L   Potassium 4.0 3.5 - 5.2 mmol/L   Chloride 103 96 - 106 mmol/L   CO2 23 20 - 29 mmol/L   Calcium  9.6 8.7 - 10.2 mg/dL   Total Protein 7.3 6.0 - 8.5 g/dL   Albumin 4.3 4.1 - 5.1 g/dL   Globulin, Total 3.0 1.5 - 4.5 g/dL   Bilirubin Total 0.9 0.0 - 1.2 mg/dL   Alkaline Phosphatase 81 47 - 123 IU/L   AST 43 (H) 0 - 40 IU/L   ALT 47 (H) 0 - 44 IU/L  TSH     Status: None   Collection Time: 02/28/24  3:26 PM  Result Value Ref Range   TSH 1.400 0.450 - 4.500 uIU/mL  Hemoglobin A1c     Status:  Abnormal   Collection Time: 02/28/24  3:26 PM  Result Value Ref Range   Hgb A1c MFr Bld 8.8 (H) 4.8 - 5.6 %    Comment:          Prediabetes: 5.7 - 6.4          Diabetes: >6.4          Glycemic control for adults with diabetes: <7.0    Est. average glucose Bld gHb Est-mCnc 206 mg/dL  Vitamin B12     Status: None   Collection Time: 02/28/24  3:26 PM  Result Value Ref Range   Vitamin B-12 509 232 - 1,245 pg/mL  CBC with Diff     Status: Abnormal   Collection Time: 02/28/24  3:26 PM  Result Value Ref Range   WBC 7.1 3.4 - 10.8 x10E3/uL   RBC 4.02 (L) 4.14 - 5.80 x10E6/uL   Hemoglobin 11.9 (L) 13.0 - 17.7 g/dL   Hematocrit 62.7 (L) 62.4 - 51.0 %   MCV 93 79 - 97 fL   MCH 29.6 26.6 - 33.0 pg   MCHC 32.0 31.5 - 35.7 g/dL   RDW 88.0 88.3 - 84.5 %   Platelets 418 150 - 450 x10E3/uL   Neutrophils 47 Not Estab. %   Lymphs 44 Not Estab. %   Monocytes 8 Not Estab. %   Eos 1 Not Estab. %   Basos 0 Not Estab. %   Neutrophils Absolute 3.3 1.4 - 7.0 x10E3/uL   Lymphocytes Absolute 3.1 0.7 - 3.1 x10E3/uL   Monocytes Absolute 0.6 0.1 - 0.9 x10E3/uL   EOS (ABSOLUTE) 0.0 0.0 - 0.4 x10E3/uL   Basophils Absolute 0.0 0.0 - 0.2 x10E3/uL   Immature Granulocytes 0 Not Estab. %   Immature Grans (Abs) 0.0 0.0 - 0.1 x10E3/uL  Lipase     Status: None   Collection Time: 02/28/24  3:26 PM  Result Value Ref Range   Lipase 30 13 - 78 U/L  POCT CBG (Fasting - Glucose)     Status: Abnormal   Collection Time: 03/09/24  2:37 PM  Result Value Ref Range   Glucose Fasting, POC 115 (A) 70 - 99 mg/dL      Assessment & Plan:  Continue medications as prescribed. Refills sent. Recheck CBC and provided patient with stool cards to complete. Reinforced healthy diet and exercise as tolerated to promote weight loss and improve chronic  diseases. Problem List Items Addressed This Visit       Cardiovascular and Mediastinum   Hypertension associated with diabetes (HCC)   Relevant Medications   amLODipine  (NORVASC )  5 MG tablet   losartan  (COZAAR ) 25 MG tablet     Endocrine   Type 2 diabetes mellitus without complication, without long-term current use of insulin  (HCC)   Relevant Medications   losartan  (COZAAR ) 25 MG tablet   Other Relevant Orders   POCT CBG (Fasting - Glucose) (Completed)   Combined hyperlipidemia associated with type 2 diabetes mellitus (HCC) - Primary   Relevant Medications   amLODipine  (NORVASC ) 5 MG tablet   losartan  (COZAAR ) 25 MG tablet     Other   Anemia   Relevant Orders   CBC with Diff   Class 2 severe obesity due to excess calories with serious comorbidity and body mass index (BMI) of 39.0 to 39.9 in adult    Return in about 2 months (around 05/07/2024).   Total time spent: 30 minutes. This time includes review of previous notes and results and patient face to face interaction during today's visit.    FERNAND FREDY RAMAN, MD  03/09/2024   This document may have been prepared by [facility] Voice Recognition software and as such may include unintentional dictation errors.     [1] No Known Allergies [2]  Outpatient Medications Prior to Visit  Medication Sig   Cholecalciferol (VITAMIN D3) 1.25 MG (50000 UT) CAPS Take 1 capsule (1.25 mg total) by mouth once a week.   glucagon  Emergency 1 MG SOLR Inject 1 mg into the skin See admin instructions. Follow package directions for low blood sugar.   insulin  glargine (LANTUS ) 100 UNIT/ML Solostar Pen Inject 15 Units into the skin daily.   Insulin  Pen Needle 29G X MISC 1 Box by Does not apply route 2 (two) times daily.   rosuvastatin  (CRESTOR ) 10 MG tablet Take 1 tablet (10 mg total) by mouth daily.   [DISCONTINUED] amLODipine  (NORVASC ) 5 MG tablet Take 5 mg by mouth daily.   [DISCONTINUED] losartan  (COZAAR ) 25 MG tablet Take 25 mg by mouth daily.   Blood Glucose Monitoring Suppl w/Device KIT 1 each by Does not apply route 2 (two) times daily at 8 am and 10 pm. (Patient not taking: Reported on 03/09/2024)   No  facility-administered medications prior to visit.   "

## 2024-05-07 ENCOUNTER — Ambulatory Visit: Admitting: Internal Medicine
